# Patient Record
Sex: Female | Born: 1955 | Race: Black or African American | Hispanic: No | Marital: Single | Smoking: Current every day smoker
Health system: Southern US, Community
[De-identification: ages and names within clinical notes are randomized; demographics above are authoritative.]

## PROBLEM LIST (undated history)

## (undated) DIAGNOSIS — I10 Essential (primary) hypertension: Secondary | ICD-10-CM

---

## 2003-01-01 ENCOUNTER — Emergency Department (HOSPITAL_COMMUNITY): Admission: EM | Admit: 2003-01-01 | Discharge: 2003-01-01 | Payer: Self-pay | Admitting: Emergency Medicine

## 2004-01-02 ENCOUNTER — Inpatient Hospital Stay (HOSPITAL_COMMUNITY): Admission: RE | Admit: 2004-01-02 | Discharge: 2004-01-05 | Payer: Self-pay | Admitting: Psychiatry

## 2004-01-02 ENCOUNTER — Ambulatory Visit: Payer: Self-pay | Admitting: Psychiatry

## 2007-02-08 ENCOUNTER — Ambulatory Visit: Payer: Self-pay | Admitting: Nurse Practitioner

## 2007-02-08 DIAGNOSIS — N951 Menopausal and female climacteric states: Secondary | ICD-10-CM | POA: Insufficient documentation

## 2007-02-08 DIAGNOSIS — I10 Essential (primary) hypertension: Secondary | ICD-10-CM | POA: Insufficient documentation

## 2007-02-08 LAB — CONVERTED CEMR LAB
ALT: 11 units/L (ref 0–35)
Albumin: 4.3 g/dL (ref 3.5–5.2)
Alkaline Phosphatase: 90 units/L (ref 39–117)
Basophils Absolute: 0 10*3/uL (ref 0.0–0.1)
Basophils Relative: 0 % (ref 0–1)
Bilirubin Urine: NEGATIVE
Blood in Urine, dipstick: NEGATIVE
CO2: 25 meq/L (ref 19–32)
Creatinine, Ser: 1.25 mg/dL — ABNORMAL HIGH (ref 0.40–1.20)
Eosinophils Absolute: 0.1 10*3/uL (ref 0.0–0.7)
Eosinophils Relative: 2 % (ref 0–5)
Glucose, Bld: 81 mg/dL (ref 70–99)
HCT: 43.1 % (ref 36.0–46.0)
Monocytes Absolute: 0.5 10*3/uL (ref 0.2–0.7)
Monocytes Relative: 8 % (ref 3–11)
Protein, U semiquant: NEGATIVE
RBC: 4.68 M/uL (ref 3.87–5.11)
RDW: 14 % (ref 11.5–14.0)
Specific Gravity, Urine: >=1.03
TSH: 1.06 microintl units/mL (ref 0.350–5.50)
Urobilinogen, UA: 0.2
WBC: 6.7 10*3/uL (ref 4.0–10.5)

## 2007-02-09 ENCOUNTER — Encounter (INDEPENDENT_AMBULATORY_CARE_PROVIDER_SITE_OTHER): Payer: Self-pay | Admitting: Nurse Practitioner

## 2007-02-10 ENCOUNTER — Ambulatory Visit: Payer: Self-pay | Admitting: *Deleted

## 2007-02-15 ENCOUNTER — Ambulatory Visit: Payer: Self-pay | Admitting: Nurse Practitioner

## 2007-02-15 DIAGNOSIS — G44209 Tension-type headache, unspecified, not intractable: Secondary | ICD-10-CM | POA: Insufficient documentation

## 2007-02-15 DIAGNOSIS — F17200 Nicotine dependence, unspecified, uncomplicated: Secondary | ICD-10-CM | POA: Insufficient documentation

## 2007-03-11 ENCOUNTER — Ambulatory Visit: Payer: Self-pay | Admitting: Nurse Practitioner

## 2007-03-11 LAB — CONVERTED CEMR LAB
Blood in Urine, dipstick: NEGATIVE
Glucose, Urine, Semiquant: NEGATIVE
Ketones, urine, test strip: NEGATIVE
WBC Urine, dipstick: NEGATIVE

## 2007-03-17 ENCOUNTER — Encounter (INDEPENDENT_AMBULATORY_CARE_PROVIDER_SITE_OTHER): Payer: Self-pay | Admitting: Nurse Practitioner

## 2007-03-19 ENCOUNTER — Ambulatory Visit (HOSPITAL_COMMUNITY): Admission: RE | Admit: 2007-03-19 | Discharge: 2007-03-19 | Payer: Self-pay | Admitting: Family Medicine

## 2007-03-24 ENCOUNTER — Ambulatory Visit: Payer: Self-pay | Admitting: Nurse Practitioner

## 2007-03-25 ENCOUNTER — Encounter (INDEPENDENT_AMBULATORY_CARE_PROVIDER_SITE_OTHER): Payer: Self-pay | Admitting: Nurse Practitioner

## 2007-04-08 ENCOUNTER — Ambulatory Visit: Payer: Self-pay | Admitting: Nurse Practitioner

## 2007-04-08 DIAGNOSIS — R51 Headache: Secondary | ICD-10-CM

## 2007-04-08 DIAGNOSIS — R519 Headache, unspecified: Secondary | ICD-10-CM | POA: Insufficient documentation

## 2007-04-12 ENCOUNTER — Ambulatory Visit (HOSPITAL_COMMUNITY): Admission: RE | Admit: 2007-04-12 | Discharge: 2007-04-12 | Payer: Self-pay | Admitting: Family Medicine

## 2007-04-22 ENCOUNTER — Ambulatory Visit: Payer: Self-pay | Admitting: Nurse Practitioner

## 2007-10-24 ENCOUNTER — Inpatient Hospital Stay (HOSPITAL_COMMUNITY): Admission: EM | Admit: 2007-10-24 | Discharge: 2007-10-26 | Payer: Self-pay | Admitting: Emergency Medicine

## 2007-10-24 DIAGNOSIS — J189 Pneumonia, unspecified organism: Secondary | ICD-10-CM | POA: Insufficient documentation

## 2007-10-29 DIAGNOSIS — E8809 Other disorders of plasma-protein metabolism, not elsewhere classified: Secondary | ICD-10-CM | POA: Insufficient documentation

## 2007-11-09 ENCOUNTER — Ambulatory Visit: Payer: Self-pay | Admitting: Nurse Practitioner

## 2007-11-09 LAB — CONVERTED CEMR LAB
BUN: 22 mg/dL (ref 6–23)
CO2: 22 meq/L (ref 19–32)
Calcium: 9.8 mg/dL (ref 8.4–10.5)
Chloride: 106 meq/L (ref 96–112)
Potassium: 4.6 meq/L (ref 3.5–5.3)
Sodium: 141 meq/L (ref 135–145)

## 2007-11-10 ENCOUNTER — Encounter (INDEPENDENT_AMBULATORY_CARE_PROVIDER_SITE_OTHER): Payer: Self-pay | Admitting: Nurse Practitioner

## 2007-11-30 ENCOUNTER — Telehealth (INDEPENDENT_AMBULATORY_CARE_PROVIDER_SITE_OTHER): Payer: Self-pay | Admitting: Nurse Practitioner

## 2007-12-13 ENCOUNTER — Encounter (INDEPENDENT_AMBULATORY_CARE_PROVIDER_SITE_OTHER): Payer: Self-pay | Admitting: *Deleted

## 2007-12-15 ENCOUNTER — Ambulatory Visit: Payer: Self-pay | Admitting: Nurse Practitioner

## 2008-03-17 ENCOUNTER — Encounter (INDEPENDENT_AMBULATORY_CARE_PROVIDER_SITE_OTHER): Payer: Self-pay | Admitting: *Deleted

## 2010-06-04 NOTE — Assessment & Plan Note (Signed)
Summary: HTN/headache   Vital Signs:  Patient Profile:   55 Years Old Female Height:     63 inches Weight:      124 pounds BMI:     22.05 Temp:     99 degrees F Pulse rate:   84 / minute Pulse rhythm:   regular Resp:     20 per minute BP sitting:   160 / 110  (left arm) Cuff size:   regular  Pt. in pain?   no  Vitals Entered By: Vesta Mixer CMA (February 15, 2007 9:04 AM)              Is Patient Diabetic? No     Chief Complaint:  f/u bp check.  History of Present Illness: Pt into the office for f/u on blood pressure. she reports that she has gotten the meds as ordered on last week and she has been taking the meds daily.   she reports that she has made some modifications to her diet. No cp, sob or palpitation. Pt admits that she is still having headaches. she reports that they are daily and that she takes tylenol - 4 at the time for relief.  She admits that she does take some excedrin migraine and headache. Pt reports that she has lost a son about 4 years ago.  she reports that she does have lots of stress and some concerns about this issue. she does not sleep well.   Needs CPE in 1 month to include PAP, mammogram, rectal, colonscopy, lipids.  she already has an appointment schedule for her CPE on next month.  Tobacco use has decreased.  She reports that she is trying to decrease.  Headache HPI:      The headaches will last anywhere from 30 minutes to several days at a time.  She has approximately 2 headaches per month.        The location of the headaches are bilateral.  Headache quality is pressure or tightness.         Hypertension History:      She complains of headache, but denies chest pain and palpitations.  She notes no problems with any antihypertensive medication side effects.  Further comments include: she is taking the medications as ordered.        Positive major cardiovascular risk factors include hypertension and current tobacco user.  Negative major  cardiovascular risk factors include female age less than 34 years old, no history of diabetes, and negative family history for ischemic heart disease.        Further assessment for target organ damage reveals no history of ASHD, cardiac end-organ damage (CHF/LVH), stroke/TIA, peripheral vascular disease, renal insufficiency, or hypertensive retinopathy.      Current Allergies (reviewed today): No known allergies      Review of Systems  Eyes      Complains of blurring.      with headache  ENT      Denies earache.  CV      Denies chest pain or discomfort, fatigue, and palpitations.  Resp      Denies cough and shortness of breath.  GI      Denies abdominal pain, nausea, and vomiting.  Neuro      Complains of headaches.   Physical Exam  General:     alert.   Head:     normocephalic.   Eyes:     pupils round.   Nose:     no external deformity.  Mouth:     poor dentition.   Neck:     supple.   Lungs:     normal respiratory effort, normal breath sounds, no crackles, and no wheezes.   Heart:     Normal rate and regular rhythm. S1 and S2 normal without gallop, murmur, click, rub or other extra sounds. Abdomen:     soft, non-tender, and normal bowel sounds.   Msk:     up to exam table, no limits Extremities:     no edema Neurologic:     alert & oriented X3.   Psych:     Oriented X3.      Impression & Recommendations:  Problem # 1:  HYPERTENSION (ICD-401.9) BP is some better but still very elevated.  reviewed expected range with pt.  handout given.  added additional medication to regimen Her updated medication list for this problem includes:    Lisinopril-hydrochlorothiazide 20-25 Mg Tabs (Lisinopril-hydrochlorothiazide) .Marland Kitchen... 1 tablet by mouth daily for blood pressure    Toprol Xl 25 Mg Tb24 (Metoprolol succinate) .Marland Kitchen... 1 tablet by mouth daily for blood pressure   Problem # 2:  TENSION HEADACHE (ICD-307.81) Advised pt not to take any excedrin migraine  or goody daily. Her updated medication list for this problem includes:    Toprol Xl 25 Mg Tb24 (Metoprolol succinate) .Marland Kitchen... 1 tablet by mouth daily for blood pressure    Tramadol Hcl 50 Mg Tabs (Tramadol hcl) .Marland Kitchen... 1 tablet by mouth daily as needed for headache   Problem # 3:  TOBACCO ABUSE (ICD-305.1) pt to continue efforts toward cessation.  pt will need CPE on next visit to include PAP,rectal, mammgram and colonscopy scheduling.  Complete Medication List: 1)  Lisinopril-hydrochlorothiazide 20-25 Mg Tabs (Lisinopril-hydrochlorothiazide) .Marland Kitchen.. 1 tablet by mouth daily for blood pressure 2)  Toprol Xl 25 Mg Tb24 (Metoprolol succinate) .Marland Kitchen.. 1 tablet by mouth daily for blood pressure 3)  Tramadol Hcl 50 Mg Tabs (Tramadol hcl) .Marland Kitchen.. 1 tablet by mouth daily as needed for headache 4)  Trazodone Hcl 50 Mg Tabs (Trazodone hcl) .Marland Kitchen.. 1 tablet by mouth at night for tension  Hypertension Assessment/Plan:      The patient's hypertensive risk group is category B: At least one risk factor (excluding diabetes) with no target organ damage.  Today's blood pressure is 160/110.  Her blood pressure goal is < 140/90.   Patient Instructions: 1)  Keep appointment as previously scheduled for complete physical exam.  Take your medications before coming to this appointment.    Prescriptions: TRAZODONE HCL 50 MG  TABS (TRAZODONE HCL) 1 tablet by mouth at night for tension  #30 x 3   Entered and Authorized by:   Lehman Prom FNP   Signed by:   Lehman Prom FNP on 02/15/2007   Method used:   Print then Give to Patient   RxID:   [id number] TRAMADOL HCL 50 MG  TABS (TRAMADOL HCL) 1 tablet by mouth daily as needed for headache  #30 x 1   Entered and Authorized by:   Lehman Prom FNP   Signed by:   Lehman Prom FNP on 02/15/2007   Method used:   Print then Give to Patient   RxID:   [id number] TOPROL XL 25 MG  TB24 (METOPROLOL SUCCINATE) 1 tablet by mouth daily for blood pressure  #30 x 3    Entered and Authorized by:   Lehman Prom FNP   Signed by:   Lehman Prom FNP on 02/15/2007   Method used:  Print then Give to Patient   RxID:   [id number]  ]

## 2010-09-17 NOTE — Discharge Summary (Signed)
Jennifer Jennifer [name], Jennifer [name] NO.:  [id number]   MEDICAL RECORD NO.:  [id number]          PATIENT TYPE:  INP   LOCATION:  3743                         FACILITY:  [facility]   PHYSICIAN:  Mick Sell, MD DATE OF BIRTH:  07-19-1955   DATE OF ADMISSION:  10/24/2007  DATE OF DISCHARGE:  10/26/2007                               DISCHARGE SUMMARY   DISCHARGE DIAGNOSES:  1. Pneumonia.  2. Swine Flu.  3. Hypertension.  4. Depression.  5. Chronic history of headache.  6. Tobacco abuse.  7. Hypalbuminemia.   DISCHARGE MEDICATIONS:  1. Avelox 400 mg one tablet by mouth daily for 7 days.  2. Tamiflu 75 mg  one tablet by mouth twice a day for 2 days.  3. Amlodipine 10 mg one tablet by mouth daily.  4. Lisinopril 20 mg one tablet by mouth daily.  5. Tylenol 650 mg one tablet by mouth every 6 hours as needed for      fever and comfort.  6. Micaderm 14 mg apply one patch over skin every 24 hours.  7. Mucinex 600 mg one tablet by mouth every 12 hours.   DISPOSITION AND FOLLOWUP:  The patient have been discharged in stable  condition with a hospital followup with nurse [facility], at 10:30 a.m;  is going to important  during this followup to check her basic metabolic panel in order to  evaluate renal function and electrolytes.  The patient just recently  started on ACE inhibitor in order to achieve a better control of her  blood pressure and also to protect her kidneys (since she is diabetic).  The patient was also discharged and receiving Tamiflu for questionable  H1N1 infection.  The hospital provide these medications for the patient.  The patient also received complete treatment for pneumonia with Avelox  400 mg per day.   PROCEDURES PERFORMED DURING THIS HOSPITALIZATION:  The patient had a  chest x-ray showed that the patient has had mediastinal contours within  normal limits.  There was airspace density with air bronchogram  identify  within the right middle lobe.  Findings consistently with right middle  lobe pneumonia.  The patient also have an electrocardiogram during this  admission that demonstrates left ventricular hypertrophy and also  findings suggesting left atrium enlargement.  No findings of acute  ischemia were noted and there were some T-waves inversion on the lateral  and inferior leads.  Cardiac enzymes negative x2.  The patient was not  complaining of chest pain.  No other procedures were performed.  Also,  nasopharyngeal swab for H1N1 testing was performed during this  admission.  The sample was sent to the state department, results still  pending at the moment of discharge.   CONSULTATIONS:  Infectious disease, specifically Dr. Orvan Falconer.   BRIEF HISTORY OF ADMISSION/PHYSICAL EXAMINATION AND LABS:  At the moment  that the patient arrived to the emergency department.  Jennifer Jennifer [name] is a  55 year old female with past medical history of hypertension and  depression who came to the emergency department  after been feeling bad  since 2 days prior to admission.  The patient reports awakened in the  morning of admission, coughing with fever, general malaise, weakness,  headache, and vomiting.  The patient endures that the headache and  fatigue are started the night before the day of admission.  At the  emergency department, the patient's blood pressure was in the 200s  range.  She have been for the last 69-month without taking any of her  blood pressure medications secondary to monetary problems.   VITAL SIGNS:  Temperature 101.6, blood pressure 204/118, heart rate 132,  respiratory rate oscillating between 36 to 40 when the patient was  speaking, and oxygen saturation 95% on room air.  GENERAL:  The patient was in mild distress, coughing, tachypneic, and  complaining of muscle ache all over her body.  HEENT:  Eyes;  pupil equal, round, and reactive to light and  accommodation.  Extraocular muscles  intact.  No icterus.  No nystagmus.  Ent; poor dentition.  Dry mucous membranes.  No erythema.  No plaque.  NECK:  Supple.  No bruits.  No thyromegaly.  RESPIRATORY:  Bibasilar rhonchi, good air movement, scattered and  diffuse bilateral expiratory wheezing, and decreased breath sound in the  right middle lobe.  CARDIOVASCULAR:  Tachycardic.  No murmurs.  No gallops.  No rales.  GASTROINTESTINAL:  Soft, nontender, and nondistended.  Positive bowel  sounds.  EXTREMITIES:  No edema, cyanosis, or clubbing of her extremities.  SKIN:  The patient was warm to touch, but there was no rash or lesion  present.  No lymphadenopathy.  NEURO:  The patient was alert, awake, and oriented x3.  Cranial nerves  II through XII intact.  Muscle strength 4/5 bilaterally and  symmetrically.  Normal finger-to-nose and no Babinski.  The patient able  to move all her joints with a full range of motion.  No apparent  deficit.   LABS:  Sodium 134, potassium 3.4, chloride 106, bicarb 22, BUN 17,  creatinine 0.9, and blood glucose 116.  White blood cells 9.7,  hemoglobin 15, hematocrit 43, and platelets 252.  The patient was  presenting with anion gap of 6.  A bilirubin of 0.6, alkaline  phosphatase 87, AST 29, ALT 16, protein 5.9, albumin 3.0, and calcium  7.7.  During this admission, hemoglobin A1c was checked, result 5.7.  The patient have a chest x-ray, please check the procedures performed  for more details regarding her chest x-ray.  The patient also have a  lipid profile that showed a total cholesterol of 145, triglycerides 40,  HDL 42, LDL 95, and a strep pneumoniae antigen in urine was negative.  UDS positive for opiates.  Blood culture was negative.  Sputum culture  was a normal present during specimen of the lower respiratory secretion,  so it was unaffectable.   HOSPITAL COURSE/PROBLEMS:  1. Upper respiratory infection with the patient presentation and also      may worrisome for the epidemic bands  of the H1N1.  The patient was      admitted in order to stabilize her condition.  We performed a      nasopharyngeal swab for the patient to be tested for H1N1 (Her      resulsts return positive, pt received appropriate treatment and      also preventive recommendation to avoid propagation of the virus at      home).  Counter precaution was taken since admission.  The patient  received good hydration.  Tamiflu twice a day 75 mg, Tylenol for      fever and comfort, as soon as the chest x-ray demonstrate      pneumonia, the patient was also started on Avelox 400 mg IV daily.      After those two days of treatment, the patient was feeling back to      normal way.  She was coughing less, no fever, no chills, and no      having any chest pain at that moment.  A decision was made to send      the patient home to complete treatment with Tamiflu for 5 days and      also to receive a complete antibiotic course with Avelox for 1      week.  These 2 medications secondary to expense were provided by      the hospital through the [facility] for the patient.  2. Hypertension that was controlled using 1 dose of clonidine and then      lisinopril and amlodipine.  The patient is going to be followed by      primary care physician on November 09, 2007, at 10:30 in the morning      when her basic metabolic panel is going to be checked, now that the      patient was started on ACE inhibitor in order to evaluate kidney      function and electrolyte status.  3. Tobacco abuse.  The patient receive formal smoking cessation      counseling during this admission.  She decided that she wants to      quit, she also receive information and tips on how to quit and the      information regarding outpatient resources to quit smoking.  The      patient was also provided with the prescription for nicotine patch      that was using inside the hospital and that she was feeling that      nicotine cessation was  started.  4. Hypoalbuminemia, secondary to poor nutrition, the patient was      started inside the hospital on Ensure and she was doing great.  Her      appetite returns to normal.   At the moment of discharge, the patient's vital signs; temperature 97.3,  blood pressure 146/89, oxygen saturation 98% on room air.  The patient  able to ambulate approximately 300 feet with the room air with oxygen  saturation of 97%.  Not complaining of shortness of breath.  The  patient' heart rate was 84 and respiratory rate 16.  At the moment of  discharge, the patient's labs with sodium 136, potassium 4.1, chloride  108, bicarb 24, glucose 84, BUN 12, and creatinine 0.95.  Microalbumin  in urine was checked during this admission with a result of 1.35, which  is in the normal range excluding at this point any renal dysfunction  secondary to hypertension.  The patient's white blood cells 6.7,  hemoglobin 12, hematocrit 36, MCV 88.9, and platelets 193.    Pt's H1N1 nasopharyngeal swab test was positive.      Rosanna Randy, MD  Electronically Signed      Mick Sell, MD     CEM/MEDQ  D:  10/26/2007  T:  10/27/2007  Job:  [id number]   cc:   Daphine Deutscher, NP

## 2010-09-20 NOTE — Consult Note (Signed)
NAME:  Jennifer [name], Jennifer [name]                          ACCOUNT NO.:  [id number]   MEDICAL RECORD NO.:  [id number]                   PATIENT TYPE:  IPS   LOCATION:  0302                                 [facility]ACILITY:  [facility]   PHYSICIAN:  Hollice Espy, M.D.            DATE OF BIRTH:  10-28-1955   DATE OF CONSULTATION:  01/02/2004  DATE OF DISCHARGE:                                   CONSULTATION   REQUESTING PHYSICIAN:  Dr. Jeanice Lim.   REASON FOR CONSULTATION:  For hypertensive management.   HISTORY OF PRESENT ILLNESS:  The patient is a 55 year old African American  female with past medical history of hypertension which she says has been  going on for about at least 5 years.  Approximately 1 year ago, her son was  murdered and since that time, she has had problems with severe depression  and grief, and has been unable to come out of it.  She was admitted to  [facility] for further evaluation and treatment of this depression.  At that time, on admission, they noted that her blood pressure had a  systolic in the 200s with a diastolic in the 110s.  The patient was not on  any blood pressure medication.  She tells me that she has not been on any  blood pressure medications for quite some time and she does not see a  primary care doctor regularly; she last saw one a year ago.  She tells me  she has been previously admitted to the hospital for hypertensive management  a few year back, but since then, has not followed up or been on any  medication.  Her blood pressure was 213/108; she received clonidine 0.1 mg,  given by physicians on site, and her blood pressure came down to  approximately 180/120.  She denies any other symptoms such as headache,  visual changes, chest pain, palpitations, shortness of breath, wheeze,  cough, abdominal pain, hematuria, dysuria, constipation, diarrhea, extremity  weakness.  She denies any headaches or nausea or vomiting.  Otherwise, she  has no other  complaints.   PAST MEDICAL HISTORY:  1.  Hypertension.  2.  Depression.   MEDICATIONS:  She is not currently on any antihypertensive medications.   ALLERGIES:  She has no known drug allergies.   SOCIAL HISTORY:  She smokes a half pack a day for most of her life.  She  denies any heavy drinking.  She denies any other drug use.  She denies any  over-the-counter medications or diet medicines.   FAMILY HISTORY:  Family history she tells me is positive for heart disease  and blood pressure.   PHYSICAL EXAM:  VITALS ON ADMISSION:  Temperature 98.6, heart rate 68,  respirations 18, O2 saturation 213/108.  GENERAL:  In general, she appears to be alert and oriented x3, in no  apparent distress, slightly flattened affect.  HEENT:  Atraumatic, normocephalic.  Her mucous membranes are moist.  NECK:  She has no carotid bruits.  HEART:  Regular rate and rhythm with no murmurs, rubs, or gallops  appreciated.  LUNGS:  Lungs clear to auscultation bilaterally.  ABDOMEN:  Abdomen soft, nontender and nondistended.  Positive bowel sounds.  EXTREMITIES:  No clubbing, cyanosis or edema.   LABORATORY WORK:  She had a salicylate level and Tylenol level drawn, both  of which were within normal limits.  Sodium 135, potassium 3.9, chloride  109, bicarb 25, BUN 7, creatinine 1, glucose 104, total bilirubin is 8 and  the rest of her LFTs are all within normal limits.  She had a normal white  count with no shift, hemoglobin and hematocrit 12.9 and 38.5, MCV of 86,  platelet count of 303,000.   ASSESSMENT AND PLAN:  1.  Hypertension, uncontrolled, likely secondary to not being on any      medication.  She responded well to clonidine and I suspect her baseline      is usually in the 160s to 180s, if not higher.  We will go ahead and      start Norvasc, Mavik and hydrochlorothiazide initially, and we will      follow patient's blood pressure.  Would not treat emergently with any      p.r.n. clonidine, unless  we have a blood pressure greater than 190.  In      addition, the patient likely will need long-term medical followup with      an established primary care physician, perhaps at [facility].  Will      also recommend a urinalysis to assure no proteinuria.  2.  Depression, as per [facility] and Dr. Kathrynn Running.                                               Hollice Espy, M.D.    SKK/MEDQ  D:  01/02/2004  T:  01/03/2004  Job:  [id number]

## 2010-09-20 NOTE — H&P (Signed)
NAME:  Jennifer [name], Jennifer [name]                          ACCOUNT NO.:  [id number]   MEDICAL RECORD NO.:  [id number]                   PATIENT TYPE:  IPS   LOCATION:  0302                                 [facility]ACILITY:  [facility]   PHYSICIAN:  Jeanice Lim, M.D.              DATE OF BIRTH:  10/21/1955   DATE OF ADMISSION:  01/02/2004  DATE OF DISCHARGE:                         PSYCHIATRIC ADMISSION ASSESSMENT   IDENTIFYING INFORMATION:  This is a 55 year old separated African-American  female voluntarily admitted on January 02, 2004.   HISTORY OF PRESENT ILLNESS:  The patient presents with a history of  depression.  The patient is having trouble with her son's traumatic death  where her son was murdered a year ago.  There is no history of this being  her son's anniversary, birthday, that triggered these depressive and ongoing  symptoms.  The patient has been keeping suicide note at home with a plan to  overdose.  The patient has been taking Excedrin and Tylenol PM to help with  sleep, taking up to six or seven on a daily basis.  The patient states that  she hopes to hear her son's voice or see him but that has not happened.  She  states that her other children do not understand her grief and she states  that she just doesn't care anymore.   PAST PSYCHIATRIC HISTORY:  First admission to [facility].  She  believed she was at a facility some time in the past.  No current outpatient  treatment.   SOCIAL HISTORY:  This is a 55 year old separated African-American female.  Has nine children, ages 46 to 7.  She lives with her son, Rosanne Ashing.  She is  unemployed.  No legal problems.   FAMILY HISTORY:  Unclear.   ALCOHOL/DRUG HISTORY:  The patient smokes.  She denies any alcohol or drug  use.   PRIMARY CARE PHYSICIAN:  None.   MEDICAL PROBLEMS:  Hypertension, uncontrolled, has been on medicine in the  past but nothing currently and states her blood pressure gets high when she  gets upset.   MEDICATIONS:  None.   ALLERGIES:  No known allergies.   PHYSICAL EXAMINATION:  GENERAL:  This is a thin female in no acute distress.  NECK:  Negative lymphadenopathy.  CHEST:  Clear.  HEART:  Regular rate and rhythm.  There is no edema.  NEUROLOGIC:  Findings are intact, nonfocal.  VITAL SIGNS:  Temperature 98.6, heart rate 68, respirations 18, blood  pressure on admission 200/110.  She is 5 feet 2-1/2 inches tall.   LABORATORY DATA:  Sodium 135, potassium 3.9, chloride 109, bicarb 25, BUN 7,  creatinine 1, glucose 104.  LFTs are within normal limits.  Normal white  cell count.  Hemoglobin 12.9, hematocrit 38.5.   MENTAL STATUS EXAM:  This is an alert, thin, middle-aged female.  Cooperative with good eye contact.  Speech is soft-spoken.  The patient  feels depressed.  The patient is tearful often throughout the interview.  Thought processes are coherent with no evidence of psychosis.  The patient  is, however, ruminating over her son's death.  Cognitive function is intact.  Memory is fair.  Judgment and insight are fair.   DIAGNOSES:   AXIS I:  Major depressive disorder.   AXIS II:  Deferred.   AXIS III:  Uncontrolled hypertension.   AXIS IV:  Psychosocial problems related to grief, medical problems.   AXIS V:  Current 30; past year 22.   PLAN:  Admission for depression, passive suicidal thoughts.  Contract for  safety.  Will initiate an antidepressant.  Will have internal medicine come  to manage the patient's hypertension.  Will initially given clonidine to  lower blood pressure.  Will have Ativan for anxiety.  Stat labs will be done  and patient may need some grief counseling or individual therapy and will  need to follow up with primary care doctor for her blood pressure.   TENTATIVE LENGTH OF STAY:  Four to five days.     Landry Corporal, N.P.                       Jeanice Lim, M.D.    JO/MEDQ  D:  01/03/2004  T:  01/03/2004  Job:  [id number]

## 2010-09-20 NOTE — Discharge Summary (Signed)
[name], Jennifer [name] NO.:  [id number]   MEDICAL RECORD NO.:  [id number]          PATIENT TYPE:  IPS   LOCATION:  0302                          FACILITY:  [facility]   PHYSICIAN:  Jennifer Jennifer [name], M.D. DATE OF BIRTH:  December 21, 1955   DATE OF ADMISSION:  01/02/2004  DATE OF DISCHARGE:  01/05/2004                                 DISCHARGE SUMMARY   The patient is a 55 year old African American female voluntarily admitted  with a history of depression having trouble with son's traumatic death; her  son was murdered a year ago.  These have been ongoing, the depressive  symptoms. The patient had been isolating on dealing with grief issues and  the nature of death was traumatic since it took awhile before the child was  found. The patient had been taking Excedrin and Tylenol to sleep; taking the  Excedrin on a daily basis. Her children do not understand her grief. It is  her first admission to [facility]. No previous treatment. No primary care  physician.   Medical problems include hypertension which has been not been controlled.  Her blood pressure gets quite high when she is upset, as per patient.   MEDICATIONS:  None.   ALLERGIES:  No known drug allergies.   PHYSICAL EXAMINATION:  NEUROLOGIC: Within normal limits. Routine admission  labs essentially within normal limits including liver function tests, mental  status exam. She is alert, thin, middle age, cooperative. Good eye contact.  Soft spoken. Feeling depressed, tearful often throughout the interview,  ruminating over son's death. Cognitive function is intact. She appears  nervous. She has symptoms of depression and complicated bereavement.  Cognition intact. Judgment and insight were partial.   ADMITTING DIAGNOSES:   AXIS I:  Major depressive disorder, single episode without psychiatric  features, and complicated bereavement.   AXIS II:  Deferred.   AXIS III:  Uncontrolled hypertension and lack of medical  treatment.   AXIS IV:  Social issues related to grief and medical problems.   AXIS V:  30/60.   The patient is admitted and ordered routine p.r.n. medications and further  monitoring. Encouraged to participate in individual and group therapy. The  patient is admitted for passive suicidal thoughts, apparently had been  stockpiling medications, and these were then thrown out by her son when she  told her son, as she has multiple children and they are aware of her  difficulty with the son's death as well as the patient's suicidal thoughts.  Therefore, she voluntarily admitted herself for that, again passive suicidal  ideation. The patient was monitored, underwent therapy, was started on  medications targeting depression as well as seen by internal medicine to  control medical problems. The patient showed remarkable response with  drastic improvement in mood now that she was plugged into an aftercare plan  that would get help her get counseling including hospice, treatment of her  depression, and possibly some family therapy to help the family members deal  with this traumatic loss. The patient was discharged in improved condition  without dangerous ideation. Mood was more euthymic, affect  better, improved  judgment and insight. Given medication education and discharged on Norvasc 5  mg q.a.m., Mobic 2 mg q.a.m., hydrochlorothiazide 25 mg q.a.m., Risperdal  0.5 mg at 9 p.m., trazodone 100 mg at 9 p.m., Zoloft 50 mg q.a.m., and  Ambien 10 mg q.h.s. p.r.n. The patient is given a three-day supply of  medications and samples and is to follow up with [facility] to establish  regular medical service for monitoring of hypertension as well as [facility], follow-up appointment on Wednesday, September  7th at 1:30.   DISCHARGE DIAGNOSES:   AXIS I:  Major depressive disorder, single episode without psychiatric  features, and complicated bereavement.   AXIS II:   Deferred.   AXIS III:  Uncontrolled hypertension and lack of medical treatment.   AXIS IV:  Social issues related to grief and medical problems.   AXIS V:  55.     Jame   JEM/MEDQ  D:  02/14/2004  T:  02/15/2004  Job:  [id number]

## 2011-01-30 LAB — LIPID PANEL
LDL Cholesterol: 95
Triglycerides: 40

## 2011-01-30 LAB — CBC
HCT: 42.7
Hemoglobin: 14.8
MCHC: 33.5
MCHC: 34.7
MCV: 87.6
MCV: 88.9
Platelets: 193
RBC: 4.03
RBC: 4.88
RDW: 13.7
WBC: 6.7

## 2011-01-30 LAB — URINALYSIS, ROUTINE W REFLEX MICROSCOPIC
Bilirubin Urine: NEGATIVE
Hgb urine dipstick: NEGATIVE
Ketones, ur: NEGATIVE
Protein, ur: NEGATIVE
pH: 5.5

## 2011-01-30 LAB — EXPECTORATED SPUTUM ASSESSMENT W GRAM STAIN, RFLX TO RESP C

## 2011-01-30 LAB — BASIC METABOLIC PANEL
BUN: 12
Calcium: 8.1 — ABNORMAL LOW
Chloride: 108
GFR calc Af Amer: >60
Glucose, Bld: 84
Potassium: 4.1
Sodium: 136

## 2011-01-30 LAB — RAPID URINE DRUG SCREEN, HOSP PERFORMED
Barbiturates: NOT DETECTED
Opiates: POSITIVE — AB
Tetrahydrocannabinol: NOT DETECTED

## 2011-01-30 LAB — DIFFERENTIAL
Lymphocytes Relative: 6 — ABNORMAL LOW
Lymphs Abs: 0.6 — ABNORMAL LOW
Neutrophils Relative %: 85 — ABNORMAL HIGH

## 2011-01-30 LAB — POCT I-STAT, CHEM 8
Calcium, Ion: 1.12
Chloride: 104
HCT: 47 — ABNORMAL HIGH
Hemoglobin: 16 — ABNORMAL HIGH
TCO2: 25

## 2011-01-30 LAB — HEMOGLOBIN A1C
Hgb A1c MFr Bld: 5.7
Mean Plasma Glucose: 126

## 2011-01-30 LAB — STREP PNEUMONIAE URINARY ANTIGEN: Strep Pneumo Urinary Antigen: NEGATIVE

## 2011-01-30 LAB — COMPREHENSIVE METABOLIC PANEL
AST: 29
Alkaline Phosphatase: 87
GFR calc Af Amer: >60
Total Bilirubin: 0.6
Total Protein: 5.9 — ABNORMAL LOW

## 2011-01-30 LAB — CULTURE, BLOOD (ROUTINE X 2): Culture: NO GROWTH

## 2011-04-22 ENCOUNTER — Emergency Department (HOSPITAL_COMMUNITY)
Admission: EM | Admit: 2011-04-22 | Discharge: 2011-04-23 | Disposition: A | Discharge status: Home / Self Care | Payer: Self-pay | Attending: Emergency Medicine | Admitting: Emergency Medicine

## 2011-04-22 ENCOUNTER — Emergency Department (HOSPITAL_COMMUNITY): Payer: Self-pay

## 2011-04-22 ENCOUNTER — Encounter: Payer: Self-pay | Admitting: Emergency Medicine

## 2011-04-22 DIAGNOSIS — I10 Essential (primary) hypertension: Secondary | ICD-10-CM | POA: Insufficient documentation

## 2011-04-22 DIAGNOSIS — R51 Headache: Secondary | ICD-10-CM | POA: Insufficient documentation

## 2011-04-22 HISTORY — DX: Essential (primary) hypertension: I10

## 2011-04-22 LAB — BASIC METABOLIC PANEL
BUN: 15 mg/dL (ref 6–23)
Calcium: 9.6 mg/dL (ref 8.4–10.5)
Chloride: 102 mEq/L (ref 96–112)
Creatinine, Ser: 0.76 mg/dL (ref 0.50–1.10)
GFR calc Af Amer: >90 mL/min (ref >90)

## 2011-04-22 LAB — CBC
HCT: 40.6 % (ref 36.0–46.0)
MCHC: 34.2 g/dL (ref 30.0–36.0)
MCV: 86.8 fL (ref 78.0–100.0)
Platelets: 305 10*3/uL (ref 150–400)
RDW: 13.6 % (ref 11.5–15.5)

## 2011-04-22 MED ORDER — LABETALOL HCL 5 MG/ML IV SOLN
20.0000 mg | Freq: Once | INTRAVENOUS | Status: AC
  Administered 2011-04-22: 20 mg via INTRAVENOUS
  Filled 2011-04-22: qty 4

## 2011-04-22 MED ORDER — LABETALOL HCL 200 MG PO TABS: 200.0000 mg | ORAL_TABLET | Freq: Two times a day (BID) | ORAL | Status: DC

## 2011-04-22 MED ORDER — HYDROCODONE-ACETAMINOPHEN 5-325 MG PO TABS: 1.0000 | ORAL_TABLET | Freq: Four times a day (QID) | ORAL | Status: AC | PRN

## 2011-04-22 MED ORDER — SODIUM CHLORIDE 0.9 % IV SOLN
INTRAVENOUS | Status: DC
  Administered 2011-04-22: 23:00:00 via INTRAVENOUS

## 2011-04-22 NOTE — ED Notes (Signed)
Pt stated that she been feel bad and tired x few days. She stated that her blood pressure has been elevated and that why she came to the hospital. Lung sounds are clear. Heart sounds are heard. Pts blood pressure is in the 200s. MD is aware.

## 2011-04-22 NOTE — ED Provider Notes (Signed)
Nursing notes and vitals signs, including pulse oximetry, reviewed.  Summary of this visit's results, reviewed by myself:  Labs:  Results for orders placed during the hospital encounter of 04/22/11  BASIC METABOLIC PANEL      Component Value Range   Sodium 137  135 - 145 (mEq/L)   Potassium 4.3  3.5 - 5.1 (mEq/L)   Chloride 102  96 - 112 (mEq/L)   CO2 26  19 - 32 (mEq/L)   Glucose, Bld 97  70 - 99 (mg/dL)   BUN 15  6 - 23 (mg/dL)   Creatinine, Ser 1.61  0.50 - 1.10 (mg/dL)   Calcium 9.6  8.4 - 09.6 (mg/dL)   GFR calc non Af Amer >90  >90 (mL/min)   GFR calc Af Amer >90  >90 (mL/min)  CBC      Component Value Range   WBC 10.0  4.0 - 10.5 (K/uL)   RBC 4.68  3.87 - 5.11 (MIL/uL)   Hemoglobin 13.9  12.0 - 15.0 (g/dL)   HCT 04.5  40.9 - 81.1 (%)   MCV 86.8  78.0 - 100.0 (fL)   MCH 29.7  26.0 - 34.0 (pg)   MCHC 34.2  30.0 - 36.0 (g/dL)   RDW 91.4  78.2 - 95.6 (%)   Platelets 305  150 - 400 (K/uL)    Imaging Studies: Ct Head Wo Contrast  04/22/2011  *RADIOLOGY REPORT*  Clinical Data: Headaches  CT HEAD WITHOUT CONTRAST  Technique:  Contiguous axial images were obtained from the base of the skull through the vertex without contrast.  Comparison: 04/12/2007  Findings: No evidence of parenchymal hemorrhage or extra-axial fluid collection. No mass lesion, mass effect, or midline shift.  No CT evidence of acute infarction.  Cerebral volume is age appropriate.  No ventriculomegaly.  The visualized paranasal sinuses are essentially clear. The mastoid air cells are unopacified.  No evidence of calvarial fracture.  IMPRESSION: No evidence of acute intracranial abnormality.  Original Report Authenticated By: Charline Bills, M.D.   11:50 PM Patient states her headache is improving. Family member mentions the patient has painful plantar warts wonders if the pain from these has been contributing to her elevated blood pressure. We will refer to podiatry. We will start on labetalol by mouth and  refer back to [facility].   Hanley Seamen, MD 04/22/11 [phone]

## 2011-04-22 NOTE — ED Notes (Signed)
PT. REPORTS HEADACHE AND GENERALIZED WEAKNESS FOR SEVERAL WEEKS WITH POOR APPETITE .

## 2011-04-22 NOTE — ED Provider Notes (Signed)
History     CSN: [id number] Arrival date & time: 04/22/2011  7:17 PM   First MD Initiated Contact with Patient 04/22/11 2244      Chief Complaint  Patient presents with  . Headache    (Consider location/radiation/quality/duration/timing/severity/associated sxs/prior treatment) Patient is a 55 y.o. female presenting with headaches. The history is provided by the patient.  Headache  Pertinent negatives include no fever and no shortness of breath.  pt w hx htn, off all bp meds x years, presents noting frequent, daily headaches in past several weeks. No focal numbness or weakness. No change in speech or vision. No neck pain or stiffness. No problems w balance or coordination. No falls or head injury. Frontal headaches, gradual onset, dull, non radiating, no specific exacerbating or alleviating factors. No abrupt change in headaches today. No sinus drainage or pain. No fever or chills. Denies any current or recent chest pain. No sob or unusual doe. No hx renal disease. No abd pain or nv. Denies cocaine or other drug use.   Past Medical History  Diagnosis Date  . Hypertension     History reviewed. No pertinent past surgical history.  No family history on file.  History  Substance Use Topics  . Smoking status: Current Everyday Smoker  . Smokeless tobacco: Not on file  . Alcohol Use: No    OB History    Grav Para Term Preterm Abortions TAB SAB Ect Mult Living                  Review of Systems  Constitutional: Negative for fever and chills.  HENT: Negative for neck pain and neck stiffness.   Eyes: Negative for pain, redness and visual disturbance.  Respiratory: Negative for shortness of breath.   Cardiovascular: Negative for chest pain and leg swelling.  Gastrointestinal: Negative for abdominal pain.  Genitourinary: Negative for flank pain.  Musculoskeletal: Negative for back pain.  Skin: Negative for rash.  Neurological: Positive for headaches. Negative for weakness and  numbness.  Hematological: Does not bruise/bleed easily.  Psychiatric/Behavioral: Negative for confusion.    Allergies  Review of patient's allergies indicates no known allergies.  Home Medications  No current outpatient prescriptions on file.  BP 210/138  Pulse 81  Temp(Src) 98.1 F (36.7 C) (Oral)  Resp 18  SpO2 99%  Physical Exam  Nursing note and vitals reviewed. Constitutional: She is oriented to person, place, and time. She appears well-developed and well-nourished. No distress.  HENT:  Head: Atraumatic.  Eyes: Conjunctivae are normal. Pupils are equal, round, and reactive to light. No scleral icterus.  Neck: Normal range of motion. Neck supple. No tracheal deviation present.  Cardiovascular: Normal rate, regular rhythm, normal heart sounds and intact distal pulses.  Exam reveals no gallop and no friction rub.   No murmur heard. Pulmonary/Chest: Effort normal and breath sounds normal. No respiratory distress.  Abdominal: Soft. Normal appearance. She exhibits no distension. There is no tenderness.  Musculoskeletal: She exhibits no edema and no tenderness.  Neurological: She is alert and oriented to person, place, and time.       Motor intact bil. Steady gait.   Skin: Skin is warm and dry. No rash noted.  Psychiatric: She has a normal mood and affect.    ED Course  Procedures (including critical care time)   Labs Reviewed  BASIC METABOLIC PANEL  CBC     MDM  Recheck bp, remains very high. labetolol iv. Ct, labs.   Ct and labs pending.  Signed out to oncoming EDP, Dr Read Drivers, to check labs and head ct when back, recheck bp, and dispo appropriately based on above/anticipate probable admit re severe htn.       Suzi Roots, MD 04/22/11 [phone]

## 2015-04-16 ENCOUNTER — Encounter (HOSPITAL_COMMUNITY): Payer: Self-pay | Admitting: Emergency Medicine

## 2015-04-16 ENCOUNTER — Emergency Department (HOSPITAL_COMMUNITY)
Admission: EM | Admit: 2015-04-16 | Discharge: 2015-04-17 | Disposition: A | Discharge status: Home / Self Care | Payer: Self-pay | Attending: Emergency Medicine | Admitting: Emergency Medicine

## 2015-04-16 DIAGNOSIS — I10 Essential (primary) hypertension: Secondary | ICD-10-CM | POA: Insufficient documentation

## 2015-04-16 DIAGNOSIS — Z79899 Other long term (current) drug therapy: Secondary | ICD-10-CM | POA: Insufficient documentation

## 2015-04-16 DIAGNOSIS — R200 Anesthesia of skin: Secondary | ICD-10-CM | POA: Insufficient documentation

## 2015-04-16 DIAGNOSIS — F17200 Nicotine dependence, unspecified, uncomplicated: Secondary | ICD-10-CM | POA: Insufficient documentation

## 2015-04-16 DIAGNOSIS — M79601 Pain in right arm: Secondary | ICD-10-CM | POA: Insufficient documentation

## 2015-04-16 LAB — COMPREHENSIVE METABOLIC PANEL
ALBUMIN: 3.7 g/dL (ref 3.5–5.0)
ALK PHOS: 107 U/L (ref 38–126)
ALT: 15 U/L (ref 14–54)
AST: 25 U/L (ref 15–41)
Anion gap: 7 (ref 5–15)
BILIRUBIN TOTAL: 0.4 mg/dL (ref 0.3–1.2)
BUN: 18 mg/dL (ref 6–20)
CALCIUM: 9.3 mg/dL (ref 8.9–10.3)
CO2: 28 mmol/L (ref 22–32)
Chloride: 104 mmol/L (ref 101–111)
Creatinine, Ser: 0.94 mg/dL (ref 0.44–1.00)
GFR calc Af Amer: >60 mL/min (ref >60)
GFR calc non Af Amer: >60 mL/min (ref >60)
GLUCOSE: 120 mg/dL — AB (ref 65–99)
Potassium: 3.7 mmol/L (ref 3.5–5.1)
SODIUM: 139 mmol/L (ref 135–145)
TOTAL PROTEIN: 7.6 g/dL (ref 6.5–8.1)

## 2015-04-16 LAB — CBC WITH DIFFERENTIAL/PLATELET
BASOS ABS: 0 10*3/uL (ref 0.0–0.1)
BASOS PCT: 0 %
Eosinophils Absolute: 0.2 10*3/uL (ref 0.0–0.7)
Eosinophils Relative: 2 %
HEMATOCRIT: 38.5 % (ref 36.0–46.0)
HEMOGLOBIN: 12.6 g/dL (ref 12.0–15.0)
Lymphocytes Relative: 39 %
Lymphs Abs: 2.7 10*3/uL (ref 0.7–4.0)
MCH: 29 pg (ref 26.0–34.0)
MCHC: 32.7 g/dL (ref 30.0–36.0)
MCV: 88.5 fL (ref 78.0–100.0)
MONOS PCT: 8 %
Monocytes Absolute: 0.6 10*3/uL (ref 0.1–1.0)
NEUTROS ABS: 3.5 10*3/uL (ref 1.7–7.7)
NEUTROS PCT: 51 %
Platelets: 290 10*3/uL (ref 150–400)
RBC: 4.35 MIL/uL (ref 3.87–5.11)
RDW: 13.3 % (ref 11.5–15.5)
WBC: 7 10*3/uL (ref 4.0–10.5)

## 2015-04-16 NOTE — ED Notes (Signed)
Pt. reports right upper arm pain onset Friday last week , denies injury , no swelling or deformity , pt. is hypertensive at triage - has not taken her antihypertensive for several months .

## 2015-04-16 NOTE — ED Provider Notes (Signed)
CSN: [id number]     Arrival date & time 04/16/15  2137 History   By signing my name below, I, Arlan OrganAshley Leger, attest that this documentation has been prepared under the direction and in the presence of Shon Batonourtney F Jalin Alicea, MD.  Electronically Signed: Arlan OrganAshley Leger, ED Scribe. 04/16/2015. 11:29 PM.   Chief Complaint  Patient presents with  . Arm Pain  . Hypertension   The history is provided by the patient. No language interpreter was used.    HPI Comments: Jennifer [name] is a 59 y.o. female with a PMHx of HTN who presents to the Emergency Department here for hypertension this evening. She reports intermittent dizziness which she states typically comes on when her blood pressure is up. Denies dizziness at this time.  However, she can not pinpoint how long her blood pressure has been elevated. She admits she is prescribed medication to manage her blood pressure, but states she has been out for some time now. Pt also reports constant, ongoing R arm pain and "numbness". She denies any recent injury or trauma to arm. [name] has attempted a friends "pain medication" which has improved some discomfort for her. Pain so worse with ROM of the right arm.  Currently she is pain free. No recent fever, chills, chest pain, or shortness of breath.  PCP: No primary care provider on file.    Past Medical History  Diagnosis Date  . Hypertension    History reviewed. No pertinent past surgical history. No family history on file. Social History  Substance Use Topics  . Smoking status: Current Every Day Smoker  . Smokeless tobacco: None  . Alcohol Use: No   OB History    No data available     Review of Systems  Constitutional: Negative for fever and chills.  Respiratory: Negative for shortness of breath.   Cardiovascular: Negative for chest pain.  Gastrointestinal: Negative for nausea, vomiting and abdominal pain.  Musculoskeletal: Positive for arthralgias.  Neurological: Positive for dizziness.  Negative for weakness and headaches.  Psychiatric/Behavioral: Negative for confusion.  All other systems reviewed and are negative.     Allergies  Review of patient's allergies indicates no known allergies.  Home Medications   Prior to Admission medications   Medication Sig Start Date End Date Taking? Authorizing Provider  hydrochlorothiazide (HYDRODIURIL) 25 MG tablet Take 1 tablet (25 mg total) by mouth daily. 04/17/15   Shon Batonourtney F Anielle Headrick, MD  lisinopril (PRINIVIL,ZESTRIL) 20 MG tablet Take 1 tablet (20 mg total) by mouth daily. 04/17/15   Shon Batonourtney F Omari Mcmanaway, MD   Triage Vitals: BP 168/101 mmHg  Pulse 65  Temp(Src) 98.4 F (36.9 C) (Oral)  Resp 12  SpO2 99%   Physical Exam  Constitutional: She is oriented to person, place, and time. No distress.  Appears older than stated age  HENT:  Head: Normocephalic and atraumatic.  Cardiovascular: Normal rate, regular rhythm and normal heart sounds.   Pulmonary/Chest: Effort normal and breath sounds normal. No respiratory distress. She has no wheezes.  Abdominal: Soft. Bowel sounds are normal. There is no tenderness. There is no rebound.  Musculoskeletal: She exhibits no edema.  Normal range of motion of the right arm, no tenderness to palpation, no obvious deformities  Neurological: She is alert and oriented to person, place, and time.  5 out of 5 strength grip strength biceps, and triceps bilaterally  Skin: Skin is warm and dry.  Psychiatric: She has a normal mood and affect.  Nursing note and vitals reviewed.  ED Course  Procedures (including critical care time)  DIAGNOSTIC STUDIES: Oxygen Saturation is 97% on RA, adequate  by my interpretation.    COORDINATION OF CARE: 11:23 PM- Will order CBC, CMP, and EKG. Discussed treatment plan with pt at bedside and pt agreed to plan.     Labs Review Labs Reviewed  COMPREHENSIVE METABOLIC PANEL - Abnormal; Notable for the following:    Glucose, Bld 120 (*)    All other components  within normal limits  CBC WITH DIFFERENTIAL/PLATELET  TROPONIN I    Imaging Review Dg Chest 2 View  04/17/2015  CLINICAL DATA:  High blood pressure and weakness.  Right arm pain. EXAM: CHEST  2 VIEW COMPARISON:  10/25/2007 FINDINGS: Cardiac enlargement without vascular congestion. Atelectasis in the lung bases. No focal airspace disease or consolidation. No blunting of costophrenic angles. No pneumothorax. Mediastinal contours appear intact. Tortuous aorta. IMPRESSION: Atelectasis in the lung bases. Mild cardiac enlargement. No focal consolidation. Electronically Signed   By: Burman Nieves M.D.   On: 04/17/2015 00:24   I have personally reviewed and evaluated these images and lab results as part of my medical decision-making.   EKG Interpretation   Date/Time:  Monday April 16 2015 21:45:20 EST Ventricular Rate:  73 PR Interval:  176 QRS Duration: 78 QT Interval:  388 QTC Calculation: 427 R Axis:   23 Text Interpretation:  Normal sinus rhythm Nonspecific ST and T wave  abnormality Abnormal ECG Confirmed by Aireona Torelli  MD, Shaul Trautman ([id number]) on  04/16/2015 11:32:06 PM      MDM   Final diagnoses:  Essential hypertension  Right arm pain   Patient presents with concerns for high blood pressure. History of the same. Not taking her medications. Also reports right upper arm pain since Friday. States that it gets better with pain medication. It is worse with range of motion. Denies chest pain. Initial blood pressure 227/119. Upon my arrival to the room blood pressure was 175/100. She's currently asymptomatic. No signs or symptoms of hypertensive urgency. Basic labwork is reassuring. Chest x-ray is negative. Feel that the right arm pain and hypertension are likely unrelated to each other. She does need to be back on blood pressure medications. She does not have a primary physician. We will give her codeine wellness follow-up. Will start HCTZ and lisinopril as an outpatient. Patient was given  strict return precautions.  After history, exam, and medical workup I feel the patient has been appropriately medically screened and is safe for discharge home. Pertinent diagnoses were discussed with the patient. Patient was given return precautions.  I personally performed the services described in this documentation, which was scribed in my presence. The recorded information has been reviewed and is accurate.   Shon Baton, MD 04/17/15 [phone]

## 2015-04-17 ENCOUNTER — Emergency Department (HOSPITAL_COMMUNITY): Payer: Self-pay

## 2015-04-17 LAB — TROPONIN I

## 2015-04-17 MED ORDER — HYDROCHLOROTHIAZIDE 25 MG PO TABS: 25.0000 mg | ORAL_TABLET | Freq: Every day | ORAL | Status: DC

## 2015-04-17 MED ORDER — LISINOPRIL 20 MG PO TABS: 20.0000 mg | ORAL_TABLET | Freq: Every day | ORAL | Status: AC

## 2015-04-17 NOTE — Discharge Instructions (Signed)
You were seen today for high blood pressure.  YOu need to be restarted on medications.  [facility]  It is very important that you take your medications.  Return if you develop chest pain or headache.    Hypertension Hypertension, commonly called high blood pressure, is when the force of blood pumping through your arteries is too strong. Your arteries are the blood vessels that carry blood from your heart throughout your body. A blood pressure reading consists of a higher number over a lower number, such as 110/72. The higher number (systolic) is the pressure inside your arteries when your heart pumps. The lower number (diastolic) is the pressure inside your arteries when your heart relaxes. Ideally you want your blood pressure below 120/80. Hypertension forces your heart to work harder to pump blood. Your arteries may become narrow or stiff. Having untreated or uncontrolled hypertension can cause heart attack, stroke, kidney disease, and other problems. RISK FACTORS Some risk factors for high blood pressure are controllable. Others are not.  Risk factors you cannot control include:   Race. You may be at higher risk if you are African American.  Age. Risk increases with age.  Gender. Men are at higher risk than women before age 10245 years. After age 10465, women are at higher risk than men. Risk factors you can control include:  Not getting enough exercise or physical activity.  Being overweight.  Getting too much fat, sugar, calories, or salt in your diet.  Drinking too much alcohol. SIGNS AND SYMPTOMS Hypertension does not usually cause signs or symptoms. Extremely high blood pressure (hypertensive crisis) may cause headache, anxiety, shortness of breath, and nosebleed. DIAGNOSIS To check if you have hypertension, your health care provider will measure your blood pressure while you are seated, with your arm held at the level of your heart. It should be measured at  least twice using the same arm. Certain conditions can cause a difference in blood pressure between your right and left arms. A blood pressure reading that is higher than normal on one occasion does not mean that you need treatment. If it is not clear whether you have high blood pressure, you may be asked to return on a different day to have your blood pressure checked again. Or, you may be asked to monitor your blood pressure at home for 1 or more weeks. TREATMENT Treating high blood pressure includes making lifestyle changes and possibly taking medicine. Living a healthy lifestyle can help lower high blood pressure. You may need to change some of your habits. Lifestyle changes may include:  Following the DASH diet. This diet is high in fruits, vegetables, and whole grains. It is low in salt, red meat, and added sugars.  Keep your sodium intake below 2,300 mg per day.  Getting at least 30-45 minutes of aerobic exercise at least 4 times per week.  Losing weight if necessary.  Not smoking.  Limiting alcoholic beverages.  Learning ways to reduce stress. Your health care provider may prescribe medicine if lifestyle changes are not enough to get your blood pressure under control, and if one of the following is true:  You are 4718-58 years of age and your systolic blood pressure is above 140.  You are 58 years of age or older, and your systolic blood pressure is above 150.  Your diastolic blood pressure is above 90.  You have diabetes, and your systolic blood pressure is over 140 or your diastolic blood pressure is  over 90.  You have kidney disease and your blood pressure is above 140/90.  You have heart disease and your blood pressure is above 140/90. Your personal target blood pressure may vary depending on your medical conditions, your age, and other factors. HOME CARE INSTRUCTIONS  Have your blood pressure rechecked as directed by your health care provider.   Take medicines only as  directed by your health care provider. Follow the directions carefully. Blood pressure medicines must be taken as prescribed. The medicine does not work as well when you skip doses. Skipping doses also puts you at risk for problems.  Do not smoke.   Monitor your blood pressure at home as directed by your health care provider. SEEK MEDICAL CARE IF:   You think you are having a reaction to medicines taken.  You have recurrent headaches or feel dizzy.  You have swelling in your ankles.  You have trouble with your vision. SEEK IMMEDIATE MEDICAL CARE IF:  You develop a severe headache or confusion.  You have unusual weakness, numbness, or feel faint.  You have severe chest or abdominal pain.  You vomit repeatedly.  You have trouble breathing. MAKE SURE YOU:   Understand these instructions.  Will watch your condition.  Will get help right away if you are not doing well or get worse.   This information is not intended to replace advice given to you by your health care provider. Make sure you discuss any questions you have with your health care provider.   Document Released: 04/21/2005 Document Revised: 09/05/2014 Document Reviewed: 02/11/2013 Elsevier Interactive Patient Education [employer]

## 2015-11-18 ENCOUNTER — Emergency Department (HOSPITAL_COMMUNITY): Payer: Self-pay

## 2015-11-18 ENCOUNTER — Emergency Department (HOSPITAL_COMMUNITY)
Admission: EM | Admit: 2015-11-18 | Discharge: 2015-11-18 | Discharge status: Against Medical Advice | Payer: Self-pay | Attending: Emergency Medicine | Admitting: Emergency Medicine

## 2015-11-18 ENCOUNTER — Encounter (HOSPITAL_COMMUNITY): Payer: Self-pay | Admitting: Emergency Medicine

## 2015-11-18 DIAGNOSIS — I160 Hypertensive urgency: Secondary | ICD-10-CM | POA: Insufficient documentation

## 2015-11-18 DIAGNOSIS — Z79899 Other long term (current) drug therapy: Secondary | ICD-10-CM | POA: Insufficient documentation

## 2015-11-18 DIAGNOSIS — R55 Syncope and collapse: Secondary | ICD-10-CM

## 2015-11-18 DIAGNOSIS — F17200 Nicotine dependence, unspecified, uncomplicated: Secondary | ICD-10-CM | POA: Insufficient documentation

## 2015-11-18 LAB — BASIC METABOLIC PANEL
Anion gap: 5 (ref 5–15)
BUN: 20 mg/dL (ref 6–20)
CO2: 25 mmol/L (ref 22–32)
CREATININE: 0.97 mg/dL (ref 0.44–1.00)
Calcium: 9.3 mg/dL (ref 8.9–10.3)
Chloride: 108 mmol/L (ref 101–111)
GFR calc non Af Amer: >60 mL/min (ref >60)
Glucose, Bld: 129 mg/dL — ABNORMAL HIGH (ref 65–99)
Potassium: 3.6 mmol/L (ref 3.5–5.1)
SODIUM: 138 mmol/L (ref 135–145)

## 2015-11-18 LAB — URINE MICROSCOPIC-ADD ON

## 2015-11-18 LAB — CBC
HEMATOCRIT: 38.8 % (ref 36.0–46.0)
Hemoglobin: 12.8 g/dL (ref 12.0–15.0)
MCH: 28.8 pg (ref 26.0–34.0)
MCHC: 33 g/dL (ref 30.0–36.0)
MCV: 87.2 fL (ref 78.0–100.0)
Platelets: 289 10*3/uL (ref 150–400)
RBC: 4.45 MIL/uL (ref 3.87–5.11)
RDW: 13.2 % (ref 11.5–15.5)
WBC: 7.9 10*3/uL (ref 4.0–10.5)

## 2015-11-18 LAB — URINALYSIS, ROUTINE W REFLEX MICROSCOPIC
BILIRUBIN URINE: NEGATIVE
GLUCOSE, UA: NEGATIVE mg/dL
Hgb urine dipstick: NEGATIVE
KETONES UR: NEGATIVE mg/dL
Nitrite: NEGATIVE
PH: 7 (ref 5.0–8.0)
Protein, ur: NEGATIVE mg/dL
SPECIFIC GRAVITY, URINE: 1.017 (ref 1.005–1.030)

## 2015-11-18 LAB — CBG MONITORING, ED: Glucose-Capillary: 119 mg/dL — ABNORMAL HIGH (ref 65–99)

## 2015-11-18 LAB — RAPID URINE DRUG SCREEN, HOSP PERFORMED
AMPHETAMINES: NOT DETECTED
BENZODIAZEPINES: NOT DETECTED
Barbiturates: NOT DETECTED
COCAINE: NOT DETECTED
Opiates: NOT DETECTED
Tetrahydrocannabinol: NOT DETECTED

## 2015-11-18 LAB — I-STAT TROPONIN, ED: TROPONIN I, POC: 0.02 ng/mL (ref 0.00–0.08)

## 2015-11-18 MED ORDER — LABETALOL HCL 5 MG/ML IV SOLN
10.0000 mg | Freq: Once | INTRAVENOUS | Status: AC
  Administered 2015-11-18: 10 mg via INTRAVENOUS
  Filled 2015-11-18: qty 4

## 2015-11-18 MED ORDER — AMLODIPINE BESYLATE 10 MG PO TABS: 10.0000 mg | ORAL_TABLET | Freq: Every day | ORAL | Status: AC

## 2015-11-18 MED ORDER — HYDROCHLOROTHIAZIDE 25 MG PO TABS: 25.0000 mg | ORAL_TABLET | Freq: Every day | ORAL | Status: AC

## 2015-11-18 NOTE — ED Notes (Signed)
Checked CBG 119, RN David informed

## 2015-11-18 NOTE — ED Provider Notes (Signed)
CSN: [id number]     Arrival date & time 11/18/15  0145 History   First MD Initiated Contact with Patient 11/18/15 0228     Chief Complaint  Patient presents with  . Loss of Consciousness     (Consider location/radiation/quality/duration/timing/severity/associated sxs/prior Treatment) HPI Jennifer [name] is a 60 y.o. female with hx of HTN, presents to ED with complaint of a syncopal episode. Pt states she was has had intermittent pain in bilateral legs which she desribes as "achy." States that today she was walking when suddenly became dizzy and woke up on the floor. Pt states she has been feeling fatigued and states "I know my blood pressure has been up." Denies chest pain. No headache. No shortness of breath. No neck pain. States she has not had BP medications in over a month, bc she does not have PCP, states prior to that she has been taking someone elses medications.   Past Medical History  Diagnosis Date  . Hypertension    History reviewed. No pertinent past surgical history. History reviewed. No pertinent family history. Social History  Substance Use Topics  . Smoking status: Current Every Day Smoker  . Smokeless tobacco: None  . Alcohol Use: No   OB History    No data available     Review of Systems  Constitutional: Positive for fatigue. Negative for fever and chills.  Respiratory: Negative for cough, chest tightness and shortness of breath.   Cardiovascular: Negative for chest pain, palpitations and leg swelling.  Gastrointestinal: Negative for nausea, vomiting, abdominal pain and diarrhea.  Genitourinary: Negative for dysuria, flank pain and pelvic pain.  Musculoskeletal: Positive for myalgias and arthralgias. Negative for neck pain and neck stiffness.  Skin: Negative for rash.  Neurological: Positive for syncope and weakness. Negative for dizziness and headaches.  All other systems reviewed and are negative.     Allergies  Review of patient's allergies indicates no  known allergies.  Home Medications   Prior to Admission medications   Medication Sig Start Date End Date Taking? Authorizing Provider  hydrochlorothiazide (HYDRODIURIL) 25 MG tablet Take 1 tablet (25 mg total) by mouth daily. Patient not taking: Reported on 11/18/2015 04/17/15   Shon Baton, MD  lisinopril (PRINIVIL,ZESTRIL) 20 MG tablet Take 1 tablet (20 mg total) by mouth daily. Patient not taking: Reported on 11/18/2015 04/17/15   Shon Baton, MD   BP 204/118 mmHg  Pulse 80  Temp(Src) 98.2 F (36.8 C) (Oral)  Resp 18  SpO2 96% Physical Exam  Constitutional: She appears well-developed and well-nourished. No distress.  HENT:  Head: Normocephalic.  Eyes: Conjunctivae and EOM are normal. Pupils are equal, round, and reactive to light.  Neck: Neck supple.  Cardiovascular: Normal rate, regular rhythm and normal heart sounds.   Pulmonary/Chest: Effort normal and breath sounds normal. No respiratory distress. She has no wheezes. She has no rales.  Abdominal: Soft. Bowel sounds are normal. She exhibits no distension. There is no tenderness. There is no rebound.  Musculoskeletal: She exhibits no edema.  No significant LE edema. DP pulses intact and equal bilaterally  Neurological: She is alert.  Skin: Skin is warm and dry.  Psychiatric: She has a normal mood and affect. Her behavior is normal.  Nursing note and vitals reviewed.   ED Course  Procedures (including critical care time) Labs Review Labs Reviewed  BASIC METABOLIC PANEL - Abnormal; Notable for the following:    Glucose, Bld 129 (*)    All other components within normal  limits  CBG MONITORING, ED - Abnormal; Notable for the following:    Glucose-Capillary 119 (*)    All other components within normal limits  CBC  URINALYSIS, ROUTINE W REFLEX MICROSCOPIC (NOT AT [facility])  URINE RAPID DRUG SCREEN, HOSP PERFORMED  I-STAT TROPOININ, ED    Imaging Review Dg Chest 2 View  11/18/2015  CLINICAL DATA:  Syncope  tonight.  Previous smoker. EXAM: CHEST  2 VIEW COMPARISON:  04/17/2015 FINDINGS: Shallow inspiration with atelectasis in the lung bases. Normal heart size and pulmonary vascularity. No focal airspace disease or consolidation. No blunting of costophrenic angles. No pneumothorax. Peribronchial thickening suggesting chronic bronchitis. Tortuous aorta. Degenerative changes in the shoulders. IMPRESSION: Atelectasis in the lung bases. Chronic bronchitic changes. No focal consolidation. Electronically Signed   By: Burman NievesWilliam  Stevens M.D.   On: 11/18/2015 03:46   Dg Wrist Complete Right  11/18/2015  CLINICAL DATA:  60 year old female with fall and right wrist pain. EXAM: RIGHT WRIST - COMPLETE 3+ VIEW COMPARISON:  None. FINDINGS: There is no evidence of fracture or dislocation. There is no evidence of arthropathy or other focal bone abnormality. Soft tissues are unremarkable. IMPRESSION: Negative. Electronically Signed   By: Elgie CollardArash  Radparvar M.D.   On: 11/18/2015 03:36   I have personally reviewed and evaluated these images and lab results as part of my medical decision-making.   EKG Interpretation   Date/Time:  Sunday November 18 2015 02:09:41 EDT Ventricular Rate:  83 PR Interval:  164 QRS Duration: 80 QT Interval:  410 QTC Calculation: 481 R Axis:   10 Text Interpretation:  Normal sinus rhythm Possible Left atrial enlargement  Abnormal ECG No significant change since last tracing Confirmed by Erroll Lunani,  Adeleke Ayokunle [phone]([id number]) on 11/18/2015 6:48:52 AM      MDM   Final diagnoses:  Hypertensive urgency  Syncope, unspecified syncope type   Patient to emergency department with elevated blood pressure, headaches, syncope episode this evening. Patient's blood pressure is 204/118. Will order some labetalol. Labs including EKG, chest x-ray, orthostatics ordered. No focal exam findings.  4:14 AM Patient with dizziness, syncope, hypertensive in ER. Blood pressure is dangerously high. Advised her that she  needed to be admitted to the hospital for further evaluation of syncope, and hypertensive urgency. Patient refused. I had a long discussion with her and told her that she is at risk of having a massive MI or CVA or even death, patient voiced understanding. Patient is alert and oriented 3, she is able to make own decisions and she understands the risks of leaving. We will discharge her AGAINST MEDICAL ADVICE, I will give her prescriptions for amlodipine and hydrochlorothiazide, I will refer her to [place]  Filed Vitals:   11/18/15 0417 11/18/15 0430 11/18/15 0435 11/18/15 0439  BP:  192/103  192/103  Pulse:  69  74  Temp:      TempSrc:      Resp:  19  19  SpO2: 98% 96% 98% 98%     Jaynie Crumbleatyana Nasiah Lehenbauer, PA-C 11/18/15 [id number]  Tomasita CrumbleAdeleke Oni, MD 11/18/15 1453

## 2015-11-18 NOTE — ED Notes (Signed)
Patient arrives with complaint of syncope. Family member reports that patient was seen falling while ambulating. EMS was called and patient was seen but refused transport. LOC endorsed by patient; she states "I felt dizzy then awoke on the ground". Aside from the Syncope, patient has been experiencing intense bilateral upper leg pain for a few days. Denies injury to legs. Only new pain endorsed at this time is right wrist pain.

## 2015-11-18 NOTE — ED Notes (Signed)
At discharge, Pt son and daughter started discussing with each other that they would not be responsible to take her home. Both children stormed out of pt rooms. Pt transported to lobby via wheelchair. Neither children were there. Pt sitting in waiting room in wheelchair. Daughter has her phone and keys.

## 2015-11-18 NOTE — Discharge Instructions (Signed)
You chose to leave against medical advise today, although you were advised that you are at hight risk of having an MI, stroke, arrhythmias, and even death. Please take blood pressure medications as prescribed. Follow up with family doctor.    Syncope Syncope is a medical term for fainting or passing out. This means you lose consciousness and drop to the ground. People are generally unconscious for less than 5 minutes. You may have some muscle twitches for up to 15 seconds before waking up and returning to normal. Syncope occurs more often in older adults, but it can happen to anyone. While most causes of syncope are not dangerous, syncope can be a sign of a serious medical problem. It is important to seek medical care.  CAUSES  Syncope is caused by a sudden drop in blood flow to the brain. The specific cause is often not determined. Factors that can bring on syncope include:  Taking medicines that lower blood pressure.  Sudden changes in posture, such as standing up quickly.  Taking more medicine than prescribed.  Standing in one place for too long.  Seizure disorders.  Dehydration and excessive exposure to heat.  Low blood sugar (hypoglycemia).  Straining to have a bowel movement.  Heart disease, irregular heartbeat, or other circulatory problems.  Fear, emotional distress, seeing blood, or severe pain. SYMPTOMS  Right before fainting, you may:  Feel dizzy or light-headed.  Feel nauseous.  See all white or all black in your field of vision.  Have cold, clammy skin. DIAGNOSIS  Your health care provider will ask about your symptoms, perform a physical exam, and perform an electrocardiogram (ECG) to record the electrical activity of your heart. Your health care provider may also perform other heart or blood tests to determine the cause of your syncope which may include:  Transthoracic echocardiogram (TTE). During echocardiography, sound waves are used to evaluate how blood flows  through your heart.  Transesophageal echocardiogram (TEE).  Cardiac monitoring. This allows your health care provider to monitor your heart rate and rhythm in real time.  Holter monitor. This is a portable device that records your heartbeat and can help diagnose heart arrhythmias. It allows your health care provider to track your heart activity for several days, if needed.  Stress tests by exercise or by giving medicine that makes the heart beat faster. TREATMENT  In most cases, no treatment is needed. Depending on the cause of your syncope, your health care provider may recommend changing or stopping some of your medicines. HOME CARE INSTRUCTIONS  Have someone stay with you until you feel stable.  Do not drive, use machinery, or play sports until your health care provider says it is okay.  Keep all follow-up appointments as directed by your health care provider.  Lie down right away if you start feeling like you might faint. Breathe deeply and steadily. Wait until all the symptoms have passed.  Drink enough fluids to keep your urine clear or pale yellow.  If you are taking blood pressure or heart medicine, get up slowly and take several minutes to sit and then stand. This can reduce dizziness. SEEK IMMEDIATE MEDICAL CARE IF:   You have a severe headache.  You have unusual pain in the chest, abdomen, or back.  You are bleeding from your mouth or rectum, or you have black or tarry stool.  You have an irregular or very fast heartbeat.  You have pain with breathing.  You have repeated fainting or seizure-like jerking during an  episode.  You faint when sitting or lying down.  You have confusion.  You have trouble walking.  You have severe weakness.  You have vision problems. If you fainted, call your local emergency services (911 in [place] Do not drive yourself to the hospital.    This information is not intended to replace advice given to you by your health care  provider. Make sure you discuss any questions you have with your health care provider.   Document Released: 04/21/2005 Document Revised: 09/05/2014 Document Reviewed: 06/20/2011 Elsevier Interactive Patient Education 2016 [employer].  Hypertension Hypertension, commonly called high blood pressure, is when the force of blood pumping through your arteries is too strong. Your arteries are the blood vessels that carry blood from your heart throughout your body. A blood pressure reading consists of a higher number over a lower number, such as 110/72. The higher number (systolic) is the pressure inside your arteries when your heart pumps. The lower number (diastolic) is the pressure inside your arteries when your heart relaxes. Ideally you want your blood pressure below 120/80. Hypertension forces your heart to work harder to pump blood. Your arteries may become narrow or stiff. Having untreated or uncontrolled hypertension can cause heart attack, stroke, kidney disease, and other problems. RISK FACTORS Some risk factors for high blood pressure are controllable. Others are not.  Risk factors you cannot control include:   Race. You may be at higher risk if you are African American.  Age. Risk increases with age.  Gender. Men are at higher risk than women before age 62 years. After age 42, women are at higher risk than men. Risk factors you can control include:  Not getting enough exercise or physical activity.  Being overweight.  Getting too much fat, sugar, calories, or salt in your diet.  Drinking too much alcohol. SIGNS AND SYMPTOMS Hypertension does not usually cause signs or symptoms. Extremely high blood pressure (hypertensive crisis) may cause headache, anxiety, shortness of breath, and nosebleed. DIAGNOSIS To check if you have hypertension, your health care provider will measure your blood pressure while you are seated, with your arm held at the level of your heart. It should be  measured at least twice using the same arm. Certain conditions can cause a difference in blood pressure between your right and left arms. A blood pressure reading that is higher than normal on one occasion does not mean that you need treatment. If it is not clear whether you have high blood pressure, you may be asked to return on a different day to have your blood pressure checked again. Or, you may be asked to monitor your blood pressure at home for 1 or more weeks. TREATMENT Treating high blood pressure includes making lifestyle changes and possibly taking medicine. Living a healthy lifestyle can help lower high blood pressure. You may need to change some of your habits. Lifestyle changes may include:  Following the DASH diet. This diet is high in fruits, vegetables, and whole grains. It is low in salt, red meat, and added sugars.  Keep your sodium intake below 2,300 mg per day.  Getting at least 30-45 minutes of aerobic exercise at least 4 times per week.  Losing weight if necessary.  Not smoking.  Limiting alcoholic beverages.  Learning ways to reduce stress. Your health care provider may prescribe medicine if lifestyle changes are not enough to get your blood pressure under control, and if one of the following is true:  You are 18-59 years of  age and your systolic blood pressure is above 140.  You are 59 years of age or older, and your systolic blood pressure is above 150.  Your diastolic blood pressure is above 90.  You have diabetes, and your systolic blood pressure is over 140 or your diastolic blood pressure is over 90.  You have kidney disease and your blood pressure is above 140/90.  You have heart disease and your blood pressure is above 140/90. Your personal target blood pressure may vary depending on your medical conditions, your age, and other factors. HOME CARE INSTRUCTIONS  Have your blood pressure rechecked as directed by your health care provider.   Take  medicines only as directed by your health care provider. Follow the directions carefully. Blood pressure medicines must be taken as prescribed. The medicine does not work as well when you skip doses. Skipping doses also puts you at risk for problems.  Do not smoke.   Monitor your blood pressure at home as directed by your health care provider. SEEK MEDICAL CARE IF:   You think you are having a reaction to medicines taken.  You have recurrent headaches or feel dizzy.  You have swelling in your ankles.  You have trouble with your vision. SEEK IMMEDIATE MEDICAL CARE IF:  You develop a severe headache or confusion.  You have unusual weakness, numbness, or feel faint.  You have severe chest or abdominal pain.  You vomit repeatedly.  You have trouble breathing. MAKE SURE YOU:   Understand these instructions.  Will watch your condition.  Will get help right away if you are not doing well or get worse.   This information is not intended to replace advice given to you by your health care provider. Make sure you discuss any questions you have with your health care provider.   Document Released: 04/21/2005 Document Revised: 09/05/2014 Document Reviewed: 02/11/2013 Elsevier Interactive Patient Education [employer] Elsevier Inc.

## 2016-09-19 ENCOUNTER — Ambulatory Visit: Payer: Self-pay | Admitting: Family Medicine

## 2018-02-05 IMAGING — CR DG CHEST 2V
2 series · 2 of 2 positions shown · non-contrast
Comparison: 04/17/2015

CLINICAL DATA: Syncope tonight.  Previous smoker.

EXAM:
CHEST  2 VIEW

[chest pa]
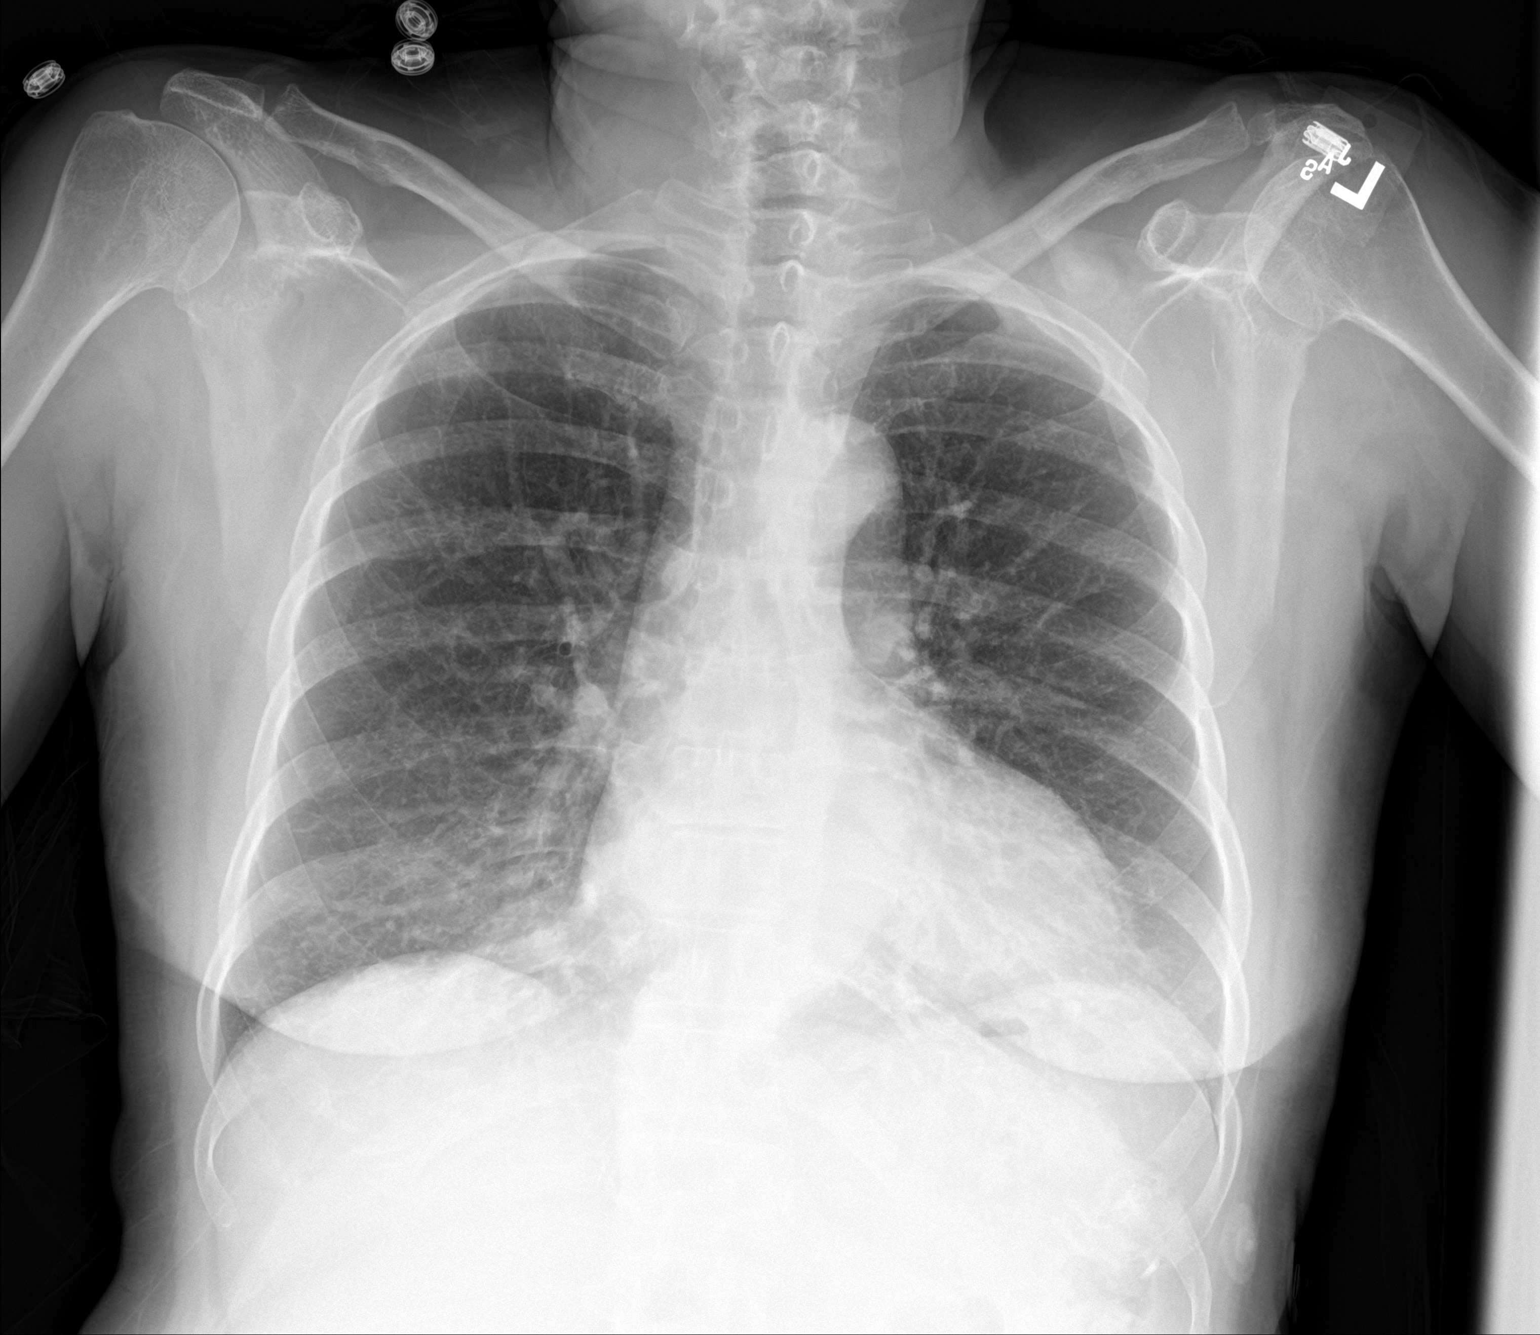

[chest lat]
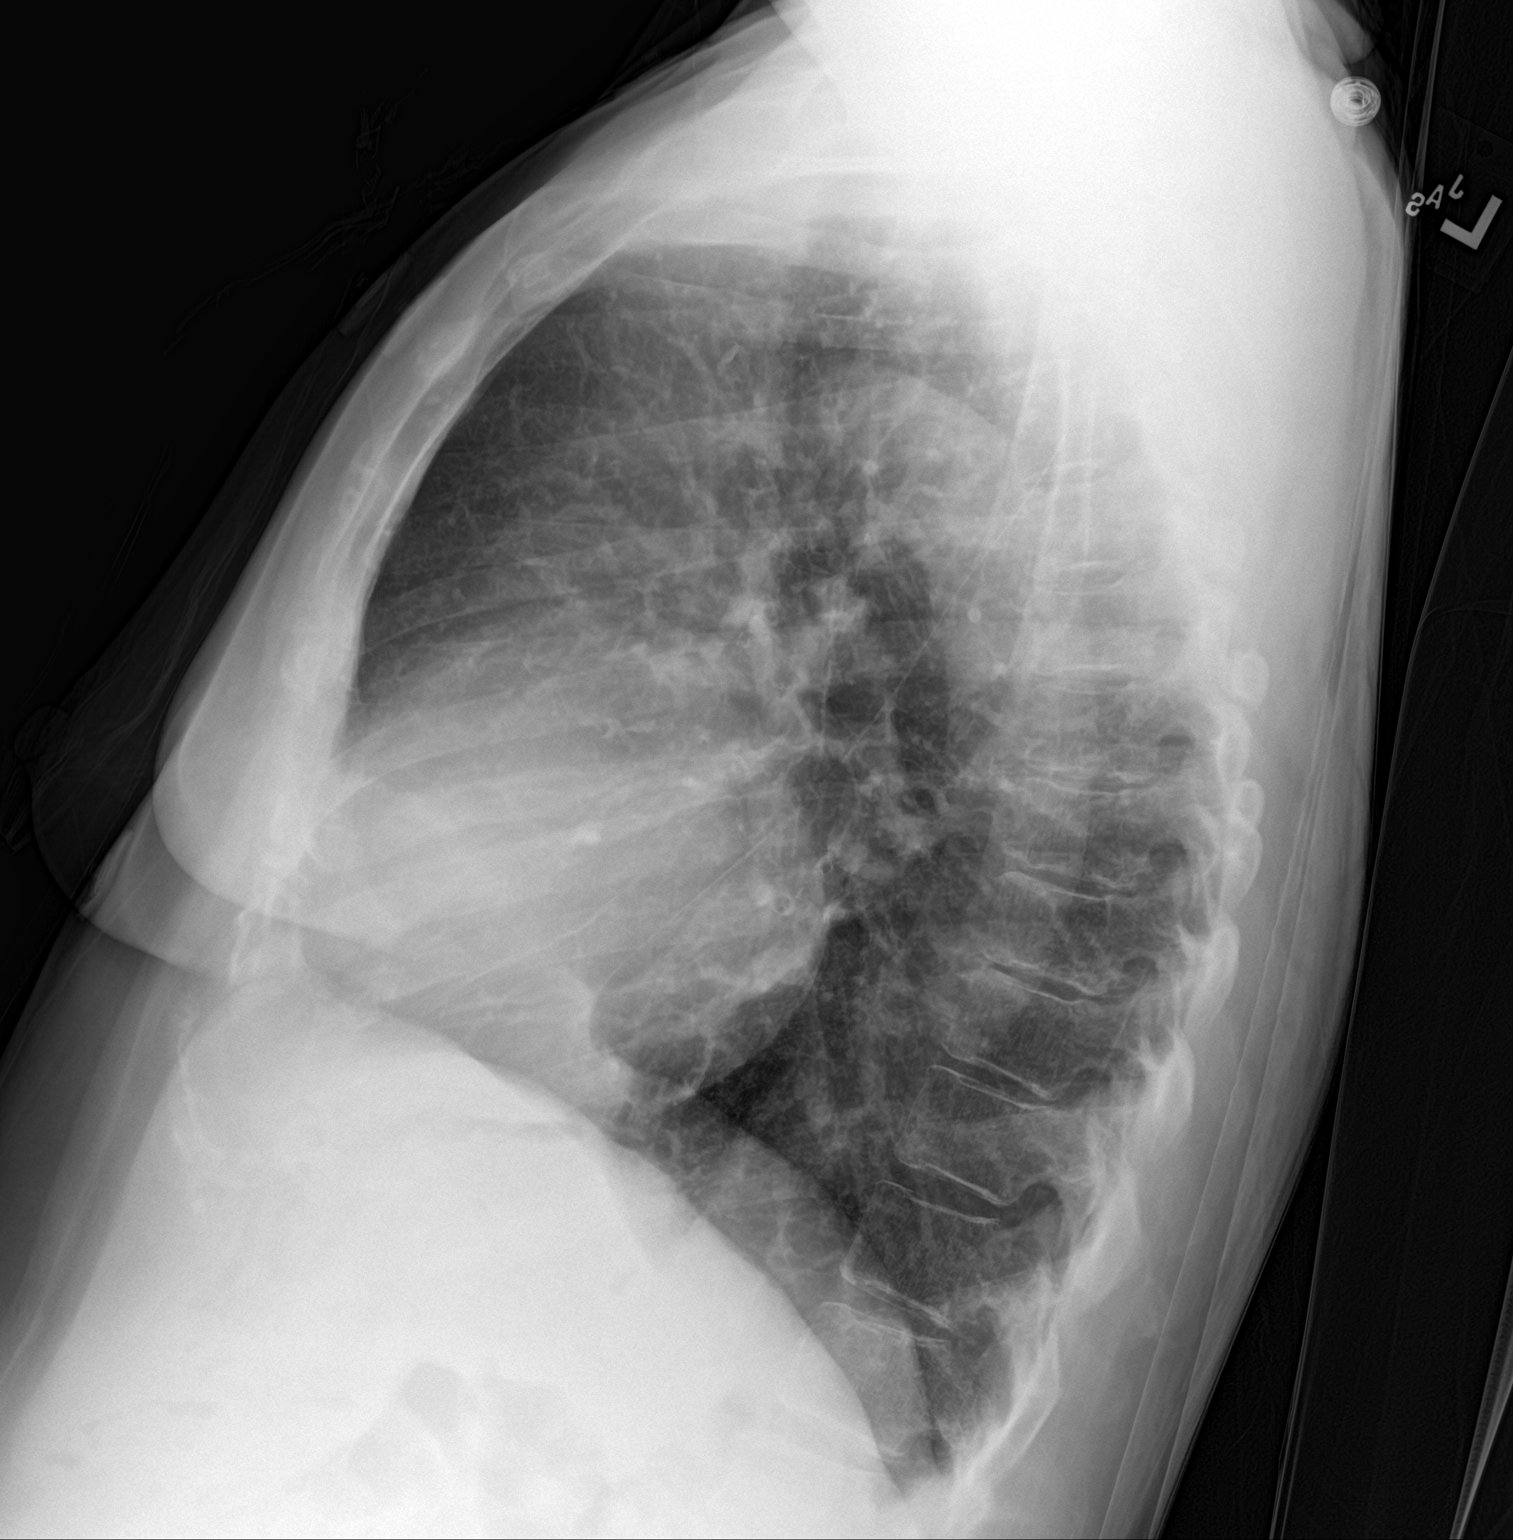

[2 of 2 positions shown; findings below may reference images not displayed]

FINDINGS: Shallow inspiration with atelectasis in the lung bases. Normal heart
size and pulmonary vascularity. No focal airspace disease or
consolidation. No blunting of costophrenic angles. No pneumothorax.
Peribronchial thickening suggesting chronic bronchitis. Tortuous
aorta. Degenerative changes in the shoulders.
IMPRESSION: Atelectasis in the lung bases. Chronic bronchitic changes. No focal
consolidation.

## 2019-09-07 ENCOUNTER — Other Ambulatory Visit: Payer: Self-pay

## 2019-09-07 ENCOUNTER — Ambulatory Visit (HOSPITAL_COMMUNITY)
Admission: EM | Admit: 2019-09-07 | Discharge: 2019-09-07 | Disposition: A | Discharge status: Home / Self Care | Payer: Self-pay | Attending: Family Medicine | Admitting: Family Medicine

## 2019-09-07 DIAGNOSIS — J029 Acute pharyngitis, unspecified: Secondary | ICD-10-CM

## 2019-09-07 DIAGNOSIS — R509 Fever, unspecified: Secondary | ICD-10-CM

## 2019-09-07 LAB — POCT RAPID STREP A: Streptococcus, Group A Screen (Direct): POSITIVE — AB

## 2019-09-07 MED ORDER — AMOXICILLIN 875 MG PO TABS: 875.0000 mg | ORAL_TABLET | Freq: Two times a day (BID) | ORAL | 0 refills | Status: AC

## 2019-09-07 MED ORDER — HYDROCODONE-ACETAMINOPHEN 5-325 MG PO TABS: 1.0000 | ORAL_TABLET | Freq: Four times a day (QID) | ORAL | 0 refills | Status: AC | PRN

## 2019-09-07 MED ORDER — ACETAMINOPHEN 325 MG PO TABS
ORAL_TABLET | ORAL | Status: AC
  Filled 2019-09-07: qty 2

## 2019-09-07 MED ORDER — ACETAMINOPHEN 325 MG PO TABS
650.0000 mg | ORAL_TABLET | Freq: Once | ORAL | Status: AC
  Administered 2019-09-07: 650 mg via ORAL

## 2019-09-07 NOTE — ED Triage Notes (Signed)
Sore throat x 2 days. Febrile upon arrival.

## 2019-09-07 NOTE — Discharge Instructions (Signed)
You may use over the counter ibuprofen or acetaminophen as needed.  °For a sore throat, over the counter products such as [employer] Peroxyl Mouth Sore Rinse or Chloraseptic Sore Throat Spray may provide some temporary relief. ° ° ° ° °

## 2019-09-10 NOTE — ED Provider Notes (Signed)
[place]   [id number] 09/07/19 Arrival Time: 1926  ASSESSMENT & PLAN:  1. Sore throat   2. Fever and chills     No signs of peritonsillar abscess. Discussed.  Begin: Meds ordered this encounter  Medications  . acetaminophen (TYLENOL) tablet 650 mg  . amoxicillin (AMOXIL) 875 MG tablet    Sig: Take 1 tablet (875 mg total) by mouth 2 (two) times daily for 10 days.    Dispense:  20 tablet    Refill:  0  . HYDROcodone-acetaminophen (NORCO/VICODIN) 5-325 MG tablet    Sig: Take 1 tablet by mouth every 6 (six) hours as needed for moderate pain or severe pain.    Dispense:  6 tablet    Refill:  0    Results for orders placed or performed during the hospital encounter of 09/07/19  POCT rapid strep A [facility])  Result Value Ref Range   Streptococcus, Group A Screen (Direct) POSITIVE (A) NEGATIVE     OTC analgesics and throat care as needed  Instructed to finish full 10 day course of antibiotics. Will follow up if not showing significant improvement over the next 24-48 hours.    Discharge Instructions      You may use over the counter ibuprofen or acetaminophen as needed.   For a sore throat, over the counter products such as [employer] Peroxyl Mouth Sore Rinse or Chloraseptic Sore Throat Spray may provide some temporary relief.       Reviewed expectations re: course of current medical issues. Questions answered. Outlined signs and symptoms indicating need for more acute intervention. Patient verbalized understanding. After Visit Summary given.   SUBJECTIVE:  Jennifer [name] is a 64 y.o. female who reports a sore throat. Describes as sharp pain with swallowing. Onset abrupt beginning yesterday. Symptoms have gradually worsened since beginning; without voice changes. No respiratory symptoms. Normal PO intake but reports discomfort with swallowing. No specific alleviating factors. Fever: believed to be present, temp not taken. No neck pain or swelling. No  associated nausea, vomiting, or abdominal pain. Known sick contacts: none. Recent travel: none. OTC treatment: analgesics without relief.     OBJECTIVE:  Vitals:   09/07/19 1954  BP: (!) 154/85  Pulse: 73  Resp: 16  Temp: (!) 101.1 F (38.4 C)  SpO2: 94%     General appearance: alert; no distress HEENT: throat with moderate erythema and with exudative tonsillar hypertrophy; uvula is midline Neck: supple with FROM; small bilat cerv LAD CV: RRR Lungs: clear to auscultation bilaterally Abd: soft; non-tender Skin: reveals no rash; warm and dry Psychological: alert and cooperative; normal mood and affect  No Known Allergies  Past Medical History:  Diagnosis Date  . Hypertension    Social History   Socioeconomic History  . Marital status: Single    Spouse name: Not on file  . Number of children: Not on file  . Years of education: Not on file  . Highest education level: Not on file  Occupational History  . Not on file  Tobacco Use  . Smoking status: Current Every Day Smoker  Substance and Sexual Activity  . Alcohol use: No  . Drug use: No  . Sexual activity: Not on file  Other Topics Concern  . Not on file  Social History Narrative  . Not on file   Social Determinants of Health   Financial Resource Strain:   . Difficulty of Paying Living Expenses:   Food Insecurity:   . Worried About Running  Out of Food in the Last Year:   . Ran Out of Food in the Last Year:   Transportation Needs:   . Lack of Transportation (Medical):   Marland Kitchen Lack of Transportation (Non-Medical):   Physical Activity:   . Days of Exercise per Week:   . Minutes of Exercise per Session:   Stress:   . Feeling of Stress :   Social Connections:   . Frequency of Communication with Friends and Family:   . Frequency of Social Gatherings with Friends and Family:   . Attends Religious Services:   . Active Member of Clubs or Organizations:   . Attends Banker Meetings:   Marland Kitchen Marital  Status:   Intimate Partner Violence:   . Fear of Current or Ex-Partner:   . Emotionally Abused:   Marland Kitchen Physically Abused:   . Sexually Abused:    No family history on file.        Mardella Layman, MD 09/10/19 [phone]

## 2022-07-23 ENCOUNTER — Other Ambulatory Visit: Payer: Self-pay | Admitting: Family

## 2022-07-23 DIAGNOSIS — Z1239 Encounter for other screening for malignant neoplasm of breast: Secondary | ICD-10-CM

## 2022-09-10 ENCOUNTER — Ambulatory Visit: Payer: Medicare Other

## 2022-10-11 ENCOUNTER — Emergency Department (HOSPITAL_COMMUNITY): Payer: Medicare Other

## 2022-10-11 ENCOUNTER — Encounter (HOSPITAL_COMMUNITY): Payer: Self-pay | Admitting: Emergency Medicine

## 2022-10-11 ENCOUNTER — Emergency Department (HOSPITAL_COMMUNITY)
Admission: EM | Admit: 2022-10-11 | Discharge: 2022-10-11 | Disposition: A | Discharge status: Home / Self Care | Payer: Medicare Other | Attending: Emergency Medicine | Admitting: Emergency Medicine

## 2022-10-11 ENCOUNTER — Other Ambulatory Visit: Payer: Self-pay

## 2022-10-11 ENCOUNTER — Encounter (HOSPITAL_COMMUNITY): Payer: Self-pay | Admitting: *Deleted

## 2022-10-11 ENCOUNTER — Ambulatory Visit (HOSPITAL_COMMUNITY)
Admission: EM | Admit: 2022-10-11 | Discharge: 2022-10-11 | Disposition: A | Discharge status: Home / Self Care | Payer: Medicare Other | Attending: Emergency Medicine | Admitting: Emergency Medicine

## 2022-10-11 DIAGNOSIS — Z20822 Contact with and (suspected) exposure to covid-19: Secondary | ICD-10-CM | POA: Insufficient documentation

## 2022-10-11 DIAGNOSIS — R519 Headache, unspecified: Secondary | ICD-10-CM | POA: Diagnosis present

## 2022-10-11 DIAGNOSIS — J039 Acute tonsillitis, unspecified: Secondary | ICD-10-CM | POA: Insufficient documentation

## 2022-10-11 DIAGNOSIS — I160 Hypertensive urgency: Secondary | ICD-10-CM

## 2022-10-11 DIAGNOSIS — R29898 Other symptoms and signs involving the musculoskeletal system: Secondary | ICD-10-CM

## 2022-10-11 LAB — TROPONIN I (HIGH SENSITIVITY)
Troponin I (High Sensitivity): 13 ng/L (ref <18)
Troponin I (High Sensitivity): 13 ng/L (ref <18)

## 2022-10-11 LAB — COMPREHENSIVE METABOLIC PANEL
ALT: 32 U/L (ref 0–44)
AST: 36 U/L (ref 15–41)
Albumin: 3.6 g/dL (ref 3.5–5.0)
Alkaline Phosphatase: 97 U/L (ref 38–126)
Anion gap: 13 (ref 5–15)
BUN: 18 mg/dL (ref 8–23)
CO2: 23 mmol/L (ref 22–32)
Calcium: 8.7 mg/dL — ABNORMAL LOW (ref 8.9–10.3)
Chloride: 100 mmol/L (ref 98–111)
Creatinine, Ser: 1.1 mg/dL — ABNORMAL HIGH (ref 0.44–1.00)
GFR, Estimated: 55 mL/min — ABNORMAL LOW (ref >60)
Glucose, Bld: 103 mg/dL — ABNORMAL HIGH (ref 70–99)
Potassium: 3.6 mmol/L (ref 3.5–5.1)
Sodium: 136 mmol/L (ref 135–145)
Total Bilirubin: 1.1 mg/dL (ref 0.3–1.2)
Total Protein: 7.9 g/dL (ref 6.5–8.1)

## 2022-10-11 LAB — CBC WITH DIFFERENTIAL/PLATELET
Abs Immature Granulocytes: 0.05 10*3/uL (ref 0.00–0.07)
Basophils Absolute: 0.1 10*3/uL (ref 0.0–0.1)
Basophils Relative: 0 %
Eosinophils Absolute: 0 10*3/uL (ref 0.0–0.5)
Eosinophils Relative: 0 %
HCT: 41.3 % (ref 36.0–46.0)
Hemoglobin: 13.6 g/dL (ref 12.0–15.0)
Immature Granulocytes: 0 %
Lymphocytes Relative: 12 %
Lymphs Abs: 1.8 10*3/uL (ref 0.7–4.0)
MCH: 29.2 pg (ref 26.0–34.0)
MCHC: 32.9 g/dL (ref 30.0–36.0)
MCV: 88.6 fL (ref 80.0–100.0)
Monocytes Absolute: 1.1 10*3/uL — ABNORMAL HIGH (ref 0.1–1.0)
Monocytes Relative: 8 %
Neutro Abs: 11.8 10*3/uL — ABNORMAL HIGH (ref 1.7–7.7)
Neutrophils Relative %: 80 %
Platelets: 255 10*3/uL (ref 150–400)
RBC: 4.66 MIL/uL (ref 3.87–5.11)
RDW: 13.3 % (ref 11.5–15.5)
WBC: 14.9 10*3/uL — ABNORMAL HIGH (ref 4.0–10.5)
nRBC: 0 % (ref 0.0–0.2)

## 2022-10-11 LAB — I-STAT CHEM 8, ED
BUN: 21 mg/dL (ref 8–23)
Calcium, Ion: 1.11 mmol/L — ABNORMAL LOW (ref 1.15–1.40)
Chloride: 101 mmol/L (ref 98–111)
Creatinine, Ser: 1.1 mg/dL — ABNORMAL HIGH (ref 0.44–1.00)
Glucose, Bld: 100 mg/dL — ABNORMAL HIGH (ref 70–99)
HCT: 44 % (ref 36.0–46.0)
Hemoglobin: 15 g/dL (ref 12.0–15.0)
Potassium: 3.6 mmol/L (ref 3.5–5.1)
Sodium: 139 mmol/L (ref 135–145)
TCO2: 26 mmol/L (ref 22–32)

## 2022-10-11 LAB — RESP PANEL BY RT-PCR (RSV, FLU A&B, COVID)  RVPGX2
Influenza A by PCR: NEGATIVE
Influenza B by PCR: NEGATIVE
Resp Syncytial Virus by PCR: NEGATIVE
SARS Coronavirus 2 by RT PCR: NEGATIVE

## 2022-10-11 LAB — LACTIC ACID, PLASMA: Lactic Acid, Venous: 1 mmol/L (ref 0.5–1.9)

## 2022-10-11 LAB — CK: Total CK: 103 U/L (ref 38–234)

## 2022-10-11 LAB — GROUP A STREP BY PCR: Group A Strep by PCR: NOT DETECTED

## 2022-10-11 MED ORDER — MORPHINE SULFATE (PF) 2 MG/ML IV SOLN
2.0000 mg | Freq: Once | INTRAVENOUS | Status: AC
  Administered 2022-10-11: 2 mg via INTRAVENOUS
  Filled 2022-10-11: qty 1

## 2022-10-11 MED ORDER — SODIUM CHLORIDE 0.9 % IV BOLUS
500.0000 mL | Freq: Once | INTRAVENOUS | Status: AC
  Administered 2022-10-11: 500 mL via INTRAVENOUS

## 2022-10-11 MED ORDER — IOHEXOL 350 MG/ML SOLN
75.0000 mL | Freq: Once | INTRAVENOUS | Status: AC | PRN
  Administered 2022-10-11: 75 mL via INTRAVENOUS

## 2022-10-11 MED ORDER — LORAZEPAM 2 MG/ML IJ SOLN
0.5000 mg | Freq: Once | INTRAMUSCULAR | Status: AC | PRN
  Administered 2022-10-11: 0.5 mg via INTRAVENOUS
  Filled 2022-10-11: qty 1

## 2022-10-11 MED ORDER — AMOXICILLIN-POT CLAVULANATE 875-125 MG PO TABS: 1.0000 | ORAL_TABLET | Freq: Two times a day (BID) | ORAL | 0 refills | Status: AC

## 2022-10-11 MED ORDER — SODIUM CHLORIDE 0.9 % IV SOLN: Freq: Once | INTRAVENOUS | Status: AC

## 2022-10-11 MED ORDER — ACETAMINOPHEN 500 MG PO TABS
1000.0000 mg | ORAL_TABLET | Freq: Once | ORAL | Status: AC
  Administered 2022-10-11: 1000 mg via ORAL
  Filled 2022-10-11: qty 2

## 2022-10-11 MED ORDER — SODIUM CHLORIDE 0.9 % IV SOLN
3.0000 g | Freq: Once | INTRAVENOUS | Status: AC
  Administered 2022-10-11: 3 g via INTRAVENOUS
  Filled 2022-10-11: qty 8

## 2022-10-11 NOTE — ED Triage Notes (Signed)
Pt reports headache, sore throat, and weakness x 2 days. Pt also reports 3 falls in the past 2 days due to the weakness. Denies hitting head.

## 2022-10-11 NOTE — ED Triage Notes (Signed)
Pt observer to be unable to stand with out assistance. 3 person assist to put Pt in chair. Pt A/O x4 but reports being weak in legs ,having a sore throat and HA. Pt vitals on triage BP 183/115,HR 88,SPO2 96,Temp 99 ,Resp 20. Pt reports she has not taken BP due to sore throat.

## 2022-10-11 NOTE — ED Notes (Signed)
Patient transported to CT 

## 2022-10-11 NOTE — Discharge Instructions (Signed)
Return for any problem.  Drink plenty of fluids.  Take Tylenol for pain.  Complete all of prescribed antibiotic.  You should have none leftover.  If you develop worsening pain, inability to take appropriate fluids, weakness please return to the ED for evaluation.

## 2022-10-11 NOTE — ED Notes (Signed)
   Patient presents for evaluation of a generalized headache, sore throat, left leg weakness, general malaise and fatigue present for 2 days.  Symptoms started abruptly and have progressively worsened.  As patient was walking to exam room, endorses likely started leaning to the left side of the cough either just after placing anterior, currently having difficulty standing.  Denies injury or trauma to the leg.  Attempted to manage with ibuprofen.  Due to sore throat visit pain with swallowing and has not been taking blood pressure medications.  In triage blood pressure 183/118, patient is ill-appearing, and has accompanying headache, patient being sent to the nearest emergency department for hypertensive urgency care for further evaluation of left leg weakness, to be escorted by family imaging   Valinda Hoar, NP 10/11/22 1056

## 2022-10-11 NOTE — ED Notes (Signed)
Patient ambulated with standby assist in the hall.

## 2022-10-11 NOTE — ED Provider Notes (Signed)
[place] EMERGENCY DEPARTMENT AT [facility] Provider Note   CSN: [id number] Arrival date & time: 10/11/22  1042     History  Chief Complaint  Patient presents with   Headache    Jennifer [name] is a 67 y.o. female.  67 year old female with prior medical history as detailed below presents for evaluation.  Patient is accompanied by family.  Patient with reported increased weakness x 2 to 3 days.  Patient with decreased p.o. intake.  Patient is primarily complaining of painful sore throat.  This hurts to swallow.  She was reports associated generalized weakness, mild headache, and left leg weakness.  Per family the patient has had 2 falls from ground-level over the last 48 hours.  She denies any specific injury related to her fall.  She reports that she felt like her left leg gave out on her secondary to his weakness.  She is without prior history of stroke.  She denies fever at home.  Patient with normal phonation and speech.  She is without confusion and is at baseline as per her mental status per family at bedside.  The history is provided by the patient and medical records.       Home Medications Prior to Admission medications   Medication Sig Start Date End Date Taking? Authorizing Provider  amLODipine (NORVASC) 10 MG tablet Take 1 tablet (10 mg total) by mouth daily. 11/18/15   Kirichenko, Tatyana, PA-C  hydrochlorothiazide (HYDRODIURIL) 25 MG tablet Take 1 tablet (25 mg total) by mouth daily. 11/18/15   Kirichenko, Lemont Fillers, PA-C  HYDROcodone-acetaminophen (NORCO/VICODIN) 5-325 MG tablet Take 1 tablet by mouth every 6 (six) hours as needed for moderate pain or severe pain. 09/07/19   Mardella Layman, MD  lisinopril (PRINIVIL,ZESTRIL) 20 MG tablet Take 1 tablet (20 mg total) by mouth daily. Patient not taking: Reported on 11/18/2015 04/17/15   Horton, Mayer Masker, MD      Allergies    Patient has no known allergies.    Review of Systems   Review of Systems  All other  systems reviewed and are negative.   Physical Exam Updated Vital Signs BP 134/85   Pulse 76   Temp 99 F (37.2 C) (Oral)   Resp (!) 21   SpO2 100%  Physical Exam Vitals and nursing note reviewed.  Constitutional:      General: She is not in acute distress.    Appearance: Normal appearance.     Comments: Alert, oriented x 4, appears to be at baseline mental status.  Complains of sore throat.  HENT:     Head: Normocephalic and atraumatic.     Comments: Advanced dental decay noted on expression of the oropharynx.  Halitosis present.  Posterior pharynx is difficult to evaluate given patient's inability to fully extend her tongue for inspection.  She complains of significant pain with this. Eyes:     General: No visual field deficit.    Conjunctiva/sclera: Conjunctivae normal.     Pupils: Pupils are equal, round, and reactive to light.  Cardiovascular:     Rate and Rhythm: Normal rate and regular rhythm.     Heart sounds: Normal heart sounds.  Pulmonary:     Effort: Pulmonary effort is normal. No respiratory distress.     Breath sounds: Normal breath sounds.  Abdominal:     General: There is no distension.     Palpations: Abdomen is soft.     Tenderness: There is no abdominal tenderness.  Musculoskeletal:  General: No deformity. Normal range of motion.     Cervical back: Normal range of motion and neck supple.  Skin:    General: Skin is warm and dry.  Neurological:     General: No focal deficit present.     Mental Status: She is alert and oriented to person, place, and time.     GCS: GCS eye subscore is 4. GCS verbal subscore is 5. GCS motor subscore is 6.     Cranial Nerves: No cranial nerve deficit, dysarthria or facial asymmetry.     Sensory: No sensory deficit.     Comments: Alert, no facial droop, normal speech, 5 out of 5 strength in both upper extremities.  5 out of 5 strength to right lower extremity.  4+ out of 5 strength to left lower extremity -most  noticeable with attempted hip flexion against gravity.     ED Results / Procedures / Treatments   Labs (all labs ordered are listed, but only abnormal results are displayed) Labs Reviewed  CBC WITH DIFFERENTIAL/PLATELET - Abnormal; Notable for the following components:      Result Value   WBC 14.9 (*)    Neutro Abs 11.8 (*)    Monocytes Absolute 1.1 (*)    All other components within normal limits  COMPREHENSIVE METABOLIC PANEL - Abnormal; Notable for the following components:   Glucose, Bld 103 (*)    Creatinine, Ser 1.10 (*)    Calcium 8.7 (*)    GFR, Estimated 55 (*)    All other components within normal limits  I-STAT CHEM 8, ED - Abnormal; Notable for the following components:   Creatinine, Ser 1.10 (*)    Glucose, Bld 100 (*)    Calcium, Ion 1.11 (*)    All other components within normal limits  GROUP A STREP BY PCR  RESP PANEL BY RT-PCR (RSV, FLU A&B, COVID)  [id number]  CULTURE, BLOOD (ROUTINE X 2)  CULTURE, BLOOD (ROUTINE X 2)  LACTIC ACID, PLASMA  CK  URINALYSIS, W/ REFLEX TO CULTURE (INFECTION SUSPECTED)  TROPONIN I (HIGH SENSITIVITY)  TROPONIN I (HIGH SENSITIVITY)    EKG None  Radiology No results found.  Procedures Procedures    Medications Ordered in ED Medications  acetaminophen (TYLENOL) tablet 1,000 mg (has no administration in time range)  sodium chloride 0.9 % bolus 500 mL (has no administration in time range)  morphine (PF) 2 MG/ML injection 2 mg (2 mg Intravenous Given 10/11/22 1139)    ED Course/ Medical Decision Making/ A&P                             Medical Decision Making Amount and/or Complexity of Data Reviewed Labs: ordered. Radiology: ordered.  Risk OTC drugs. Prescription drug management.    Medical Screen Complete  This patient presented to the ED with complaint of sore throat, weakness, left leg weakness, malaise.  This complaint involves an extensive number of treatment options. The initial differential diagnosis  includes, but is not limited to, metabolic abnormality, infection, viral versus bacterial infection, tonsillitis, exudative pharyngitis, epiglottitis, odontogenic infection, CVA,etc.  This presentation is: Acute, Chronic, Self-Limited, Previously Undiagnosed, Uncertain Prognosis, Complicated, Systemic Symptoms, and Threat to Life/Bodily Function  Patient with gradually worsening symptoms x 2 to 3 days.  Primary complaint is of sore throat and general malaise.  Patient also complains of generalized weakness with significant weakness in the left lower extremity.  Extensive workup initiated in ED.  Notable  findings include viral testing is negative, strep test is negative, patient with mild white count of 14.9, creatinine is 1.1 with a BUN of 18, CK is normal, troponin is barely detectable, lactic acid is normal.  CT imaging of the head does not demonstrate acute pathology.  CT soft tissue neck demonstrates possible tonsillitis without abscess.  With IV fluids, pain medication, antibiotics patient feels significantly improved.  MRI brain does not reveal evidence of acute stroke.  Patient offered and encouraged to remain overnight for observation and continued IV fluids and antibiotics.  Patient refuses.  She reports that she feels significantly improved after ED evaluation and treatment.  Patient is taking good p.o. prior to discharge.  She is ambulatory without difficulty prior to discharge.  Patient is encouraged to closely follow-up with her regular care providers in the outpatient setting.  Importance of close follow-up is repeatedly stressed.  Strict return precautions given and understood.  Additional history obtained:  External records from outside sources obtained and reviewed including prior ED visits and prior Inpatient records.    Lab Tests:  I ordered and personally interpreted labs.    Imaging Studies ordered:  I ordered imaging studies including CT head, CT soft tissue  neck, MRI brain I independently visualized and interpreted obtained imaging which showed possible tonsillitis I agree with the radiologist interpretation.   Cardiac Monitoring:  The patient was maintained on a cardiac monitor.  I personally viewed and interpreted the cardiac monitor which showed an underlying rhythm of: NSR   Medicines ordered:  I ordered medication including IV fluids, antibiotics, narcotics for sore throat Reevaluation of the patient after these medicines showed that the patient: improved  Problem List / ED Course:  Tonsillitis, weakness   Reevaluation:  After the interventions noted above, I reevaluated the patient and found that they have: improved   Disposition:  After consideration of the diagnostic results and the patients response to treatment, I feel that the patent would benefit from close outpatient follow-up.          Final Clinical Impression(s) / ED Diagnoses Final diagnoses:  Tonsillitis    Rx / DC Orders ED Discharge Orders          Ordered    amoxicillin-clavulanate (AUGMENTIN) 875-125 MG tablet  Every 12 hours        10/11/22 1745              Wynetta Fines, MD 10/11/22 1750

## 2022-10-13 LAB — CULTURE, BLOOD (ROUTINE X 2): Special Requests: ADEQUATE

## 2022-10-16 LAB — CULTURE, BLOOD (ROUTINE X 2): Culture: NO GROWTH

## 2023-08-05 ENCOUNTER — Other Ambulatory Visit: Payer: Self-pay | Admitting: Family

## 2023-08-05 DIAGNOSIS — E038 Other specified hypothyroidism: Secondary | ICD-10-CM

## 2023-08-05 DIAGNOSIS — E042 Nontoxic multinodular goiter: Secondary | ICD-10-CM
